# Patient Record
Sex: Male | Born: 1940 | Race: White | Hispanic: No | Marital: Married | Smoking: Former smoker
Health system: Southern US, Community
[De-identification: ages and names within clinical notes are randomized; demographics above are authoritative.]

## PROBLEM LIST (undated history)

## (undated) DIAGNOSIS — N400 Enlarged prostate without lower urinary tract symptoms: Secondary | ICD-10-CM

## (undated) DIAGNOSIS — G629 Polyneuropathy, unspecified: Secondary | ICD-10-CM

## (undated) DIAGNOSIS — E039 Hypothyroidism, unspecified: Secondary | ICD-10-CM

## (undated) DIAGNOSIS — R519 Headache, unspecified: Secondary | ICD-10-CM

## (undated) DIAGNOSIS — E785 Hyperlipidemia, unspecified: Secondary | ICD-10-CM

## (undated) DIAGNOSIS — R51 Headache: Secondary | ICD-10-CM

## (undated) DIAGNOSIS — IMO0001 Reserved for inherently not codable concepts without codable children: Secondary | ICD-10-CM

## (undated) DIAGNOSIS — M5410 Radiculopathy, site unspecified: Secondary | ICD-10-CM

## (undated) DIAGNOSIS — M1990 Unspecified osteoarthritis, unspecified site: Secondary | ICD-10-CM

## (undated) DIAGNOSIS — K219 Gastro-esophageal reflux disease without esophagitis: Secondary | ICD-10-CM

## (undated) HISTORY — PX: TONSILLECTOMY: SUR1361

## (undated) HISTORY — PX: INGUINAL HERNIA REPAIR: SUR1180

## (undated) HISTORY — PX: COLONOSCOPY: SHX174

## (undated) HISTORY — DX: Hyperlipidemia, unspecified: E78.5

## (undated) HISTORY — DX: Polyneuropathy, unspecified: G62.9

## (undated) HISTORY — PX: SHOULDER SURGERY: SHX246

## (undated) HISTORY — DX: Radiculopathy, site unspecified: M54.10

## (undated) HISTORY — PX: CATARACT EXTRACTION, BILATERAL: SHX1313

## (undated) HISTORY — DX: Hypothyroidism, unspecified: E03.9

## (undated) HISTORY — PX: UPPER GASTROINTESTINAL ENDOSCOPY: SHX188

## (undated) HISTORY — DX: Reserved for inherently not codable concepts without codable children: IMO0001

## (undated) HISTORY — PX: KNEE ARTHROSCOPY: SUR90

## (undated) HISTORY — DX: Gastro-esophageal reflux disease without esophagitis: K21.9

---

## 1999-01-14 ENCOUNTER — Emergency Department (HOSPITAL_COMMUNITY): Admission: EM | Admit: 1999-01-14 | Discharge: 1999-01-14 | Payer: Self-pay | Admitting: Emergency Medicine

## 1999-01-14 ENCOUNTER — Encounter: Payer: Self-pay | Admitting: Emergency Medicine

## 1999-02-26 ENCOUNTER — Other Ambulatory Visit: Admission: RE | Admit: 1999-02-26 | Discharge: 1999-02-26 | Payer: Self-pay | Admitting: Gastroenterology

## 2000-04-01 ENCOUNTER — Ambulatory Visit (HOSPITAL_COMMUNITY): Admission: RE | Admit: 2000-04-01 | Discharge: 2000-04-01 | Payer: Self-pay | Admitting: Endocrinology

## 2000-04-01 ENCOUNTER — Encounter: Payer: Self-pay | Admitting: Endocrinology

## 2004-08-31 ENCOUNTER — Ambulatory Visit: Payer: Self-pay | Admitting: Endocrinology

## 2004-09-07 ENCOUNTER — Ambulatory Visit: Payer: Self-pay | Admitting: Endocrinology

## 2004-10-09 ENCOUNTER — Ambulatory Visit: Payer: Self-pay | Admitting: Endocrinology

## 2005-09-03 ENCOUNTER — Ambulatory Visit: Payer: Self-pay | Admitting: Endocrinology

## 2005-09-10 ENCOUNTER — Ambulatory Visit: Payer: Self-pay | Admitting: Endocrinology

## 2005-11-01 ENCOUNTER — Ambulatory Visit: Payer: Self-pay | Admitting: Endocrinology

## 2006-07-14 ENCOUNTER — Ambulatory Visit: Payer: Self-pay | Admitting: Gastroenterology

## 2006-07-15 ENCOUNTER — Ambulatory Visit: Payer: Self-pay | Admitting: Gastroenterology

## 2006-07-16 ENCOUNTER — Emergency Department (HOSPITAL_COMMUNITY): Admission: EM | Admit: 2006-07-16 | Discharge: 2006-07-16 | Payer: Self-pay | Admitting: Emergency Medicine

## 2006-07-18 ENCOUNTER — Ambulatory Visit: Payer: Self-pay | Admitting: Endocrinology

## 2006-07-18 ENCOUNTER — Ambulatory Visit: Payer: Self-pay | Admitting: Gastroenterology

## 2006-07-27 ENCOUNTER — Emergency Department (HOSPITAL_COMMUNITY): Admission: EM | Admit: 2006-07-27 | Discharge: 2006-07-27 | Payer: Self-pay | Admitting: Emergency Medicine

## 2006-08-26 ENCOUNTER — Ambulatory Visit: Payer: Self-pay | Admitting: Endocrinology

## 2006-09-03 ENCOUNTER — Encounter: Admission: RE | Admit: 2006-09-03 | Discharge: 2006-09-03 | Payer: Self-pay | Admitting: Endocrinology

## 2006-09-07 ENCOUNTER — Ambulatory Visit: Payer: Self-pay

## 2006-09-23 ENCOUNTER — Ambulatory Visit: Payer: Self-pay | Admitting: Endocrinology

## 2006-09-23 LAB — CONVERTED CEMR LAB
ALT: 20 units/L (ref 0–40)
AST: 23 units/L (ref 0–37)
Alkaline Phosphatase: 46 units/L (ref 39–117)
Basophils Relative: 1 % (ref 0.0–1.0)
GFR calc non Af Amer: 72 mL/min
Glomerular Filtration Rate, Af Am: 87 mL/min/{1.73_m2}
Glucose, Bld: 107 mg/dL — ABNORMAL HIGH (ref 70–99)
HCT: 44.6 % (ref 39.0–52.0)
HDL: 34.7 mg/dL — ABNORMAL LOW (ref >39.0)
Leukocytes, UA: NEGATIVE
Monocytes Absolute: 0.6 10*3/uL (ref 0.2–0.7)
PSA: 0.59 ng/mL (ref 0.10–4.00)
Platelets: 302 10*3/uL (ref 150–400)
Potassium: 4.5 meq/L (ref 3.5–5.1)
RBC: 4.85 M/uL (ref 4.22–5.81)
RDW: 11.9 % (ref 11.5–14.6)
Sodium: 138 meq/L (ref 135–145)
Specific Gravity, Urine: 1.025 (ref 1.000–1.03)
Total Bilirubin: 1.6 mg/dL — ABNORMAL HIGH (ref 0.3–1.2)
Total Protein, Urine: NEGATIVE mg/dL
Total Protein: 6.9 g/dL (ref 6.0–8.3)
Triglyceride fasting, serum: 94 mg/dL (ref 0–149)
Urobilinogen, UA: 0.2 (ref 0.0–1.0)
VLDL: 19 mg/dL (ref 0–40)
WBC: 6.9 10*3/uL (ref 4.5–10.5)
pH: 6 (ref 5.0–8.0)

## 2006-09-30 ENCOUNTER — Ambulatory Visit (HOSPITAL_COMMUNITY): Admission: RE | Admit: 2006-09-30 | Discharge: 2006-09-30 | Payer: Self-pay | Admitting: General Surgery

## 2006-10-21 ENCOUNTER — Ambulatory Visit: Payer: Self-pay | Admitting: Endocrinology

## 2006-11-28 ENCOUNTER — Ambulatory Visit: Payer: Self-pay | Admitting: Endocrinology

## 2006-11-28 LAB — CONVERTED CEMR LAB
Cholesterol: 160 mg/dL (ref 0–200)
HDL: 35.3 mg/dL — ABNORMAL LOW (ref >39.0)
VLDL: 18 mg/dL (ref 0–40)

## 2007-02-21 ENCOUNTER — Emergency Department (HOSPITAL_COMMUNITY): Admission: EM | Admit: 2007-02-21 | Discharge: 2007-02-21 | Payer: Self-pay | Admitting: Emergency Medicine

## 2007-05-19 ENCOUNTER — Encounter: Payer: Self-pay | Admitting: Endocrinology

## 2007-05-19 DIAGNOSIS — K219 Gastro-esophageal reflux disease without esophagitis: Secondary | ICD-10-CM | POA: Insufficient documentation

## 2007-05-19 DIAGNOSIS — K5730 Diverticulosis of large intestine without perforation or abscess without bleeding: Secondary | ICD-10-CM | POA: Insufficient documentation

## 2007-06-20 IMAGING — CR DG CHEST 1V PORT
1 series · 1 of 1 positions shown · non-contrast
Comparison: No prior studies.

CLINICAL DATA: Chest pain, nausea, vomiting, dizziness and weakness. 
 PORTABLE CHEST - 1 VIEW ? 07/27/06 (5995):

[view not recorded]
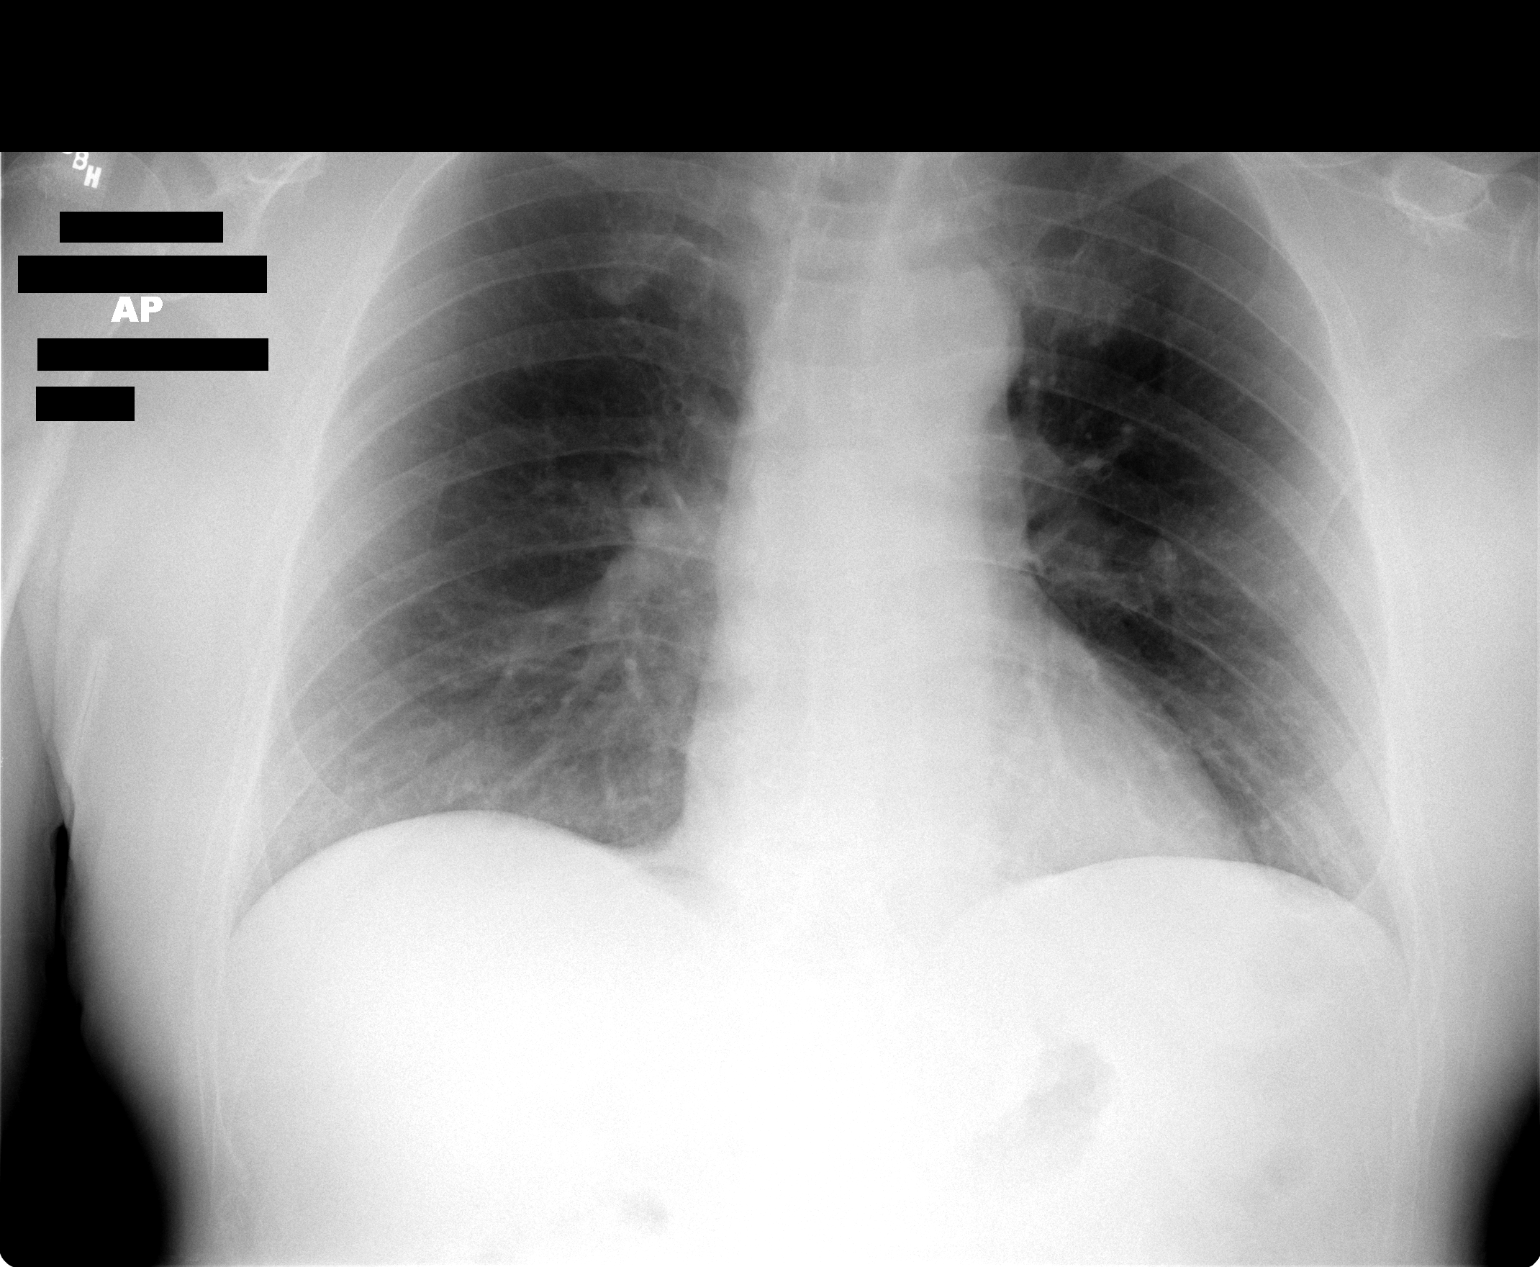

[1 of 1 positions shown; findings below may reference images not displayed]

FINDINGS: Very mild bibasilar atelectasis is present.  No edema or focal infiltrate.  No pleural fluid is identified.  The heart size is normal.
IMPRESSION: Mild bibasilar atelectasis.

## 2008-10-04 HISTORY — PX: PROSTATE SURGERY: SHX751

## 2008-12-09 ENCOUNTER — Encounter: Admission: RE | Admit: 2008-12-09 | Discharge: 2008-12-09 | Payer: Self-pay | Admitting: Family Medicine

## 2009-06-26 ENCOUNTER — Encounter (INDEPENDENT_AMBULATORY_CARE_PROVIDER_SITE_OTHER): Payer: Self-pay | Admitting: Urology

## 2009-06-26 ENCOUNTER — Ambulatory Visit (HOSPITAL_BASED_OUTPATIENT_CLINIC_OR_DEPARTMENT_OTHER): Admission: RE | Admit: 2009-06-26 | Discharge: 2009-06-27 | Payer: Self-pay | Admitting: Urology

## 2011-02-19 NOTE — Assessment & Plan Note (Signed)
[facility]                           GASTROENTEROLOGY OFFICE NOTE   NAME:SPROLESOren, Barella                       MRN:          [id number]  DATE:07/14/2006                            DOB:          03/22/1941    Mr. Polhemus is a 70 year old white male self-referred for evaluation of  rectal pain, itching, burning, and bleeding.   HISTORY OF PRESENT ILLNESS:  I have previously evaluated Mr. Natarajan for  history of diverticulitis. He was status post flexible sigmoidoscopy in June  1999 which revealed diverticulosis and internal hemorrhoids. He underwent  colonoscopy in May 2000 which revealed small to moderate sized internal  hemorrhoids, diverticulosis, and an ascending colon polyp with lesion which  turned out to be polypoid mucosa. He states over the past few months he has  had problems with recurrent anal and rectal pain, burning, itching, and  small amounts of blood per rectum. He has had temporary relief from Anusol  HC suppositories, but his symptoms recur as soon as he discontinues the  medication. His symptoms have not been fully controlled with the medication.  His symptoms did not respond to over-the-counter Preparation H. He has noted  a very slight change in his bowel habit pattern with a long history of about  two bowel movements a day, and now he generally has 2-3 bowel movements a  day. He has no abdominal pain, weight loss, fevers, chills or melena. No  family history of colon cancer, colon polyps or inflammatory bowel disease.   PAST MEDICAL HISTORY:  Hypertension, hyperlipidemia, hypothyroidism,  diverticulosis, hemorrhoids, GERD, status post left inguinal hernia repair,  history of duodenitis.   CURRENT MEDICATIONS:  Listed on the chart have been reviewed.   MEDICATION ALLERGIES:  None known.   Social History and Review of Systems per the handwritten form.   PHYSICAL EXAMINATION:  GENERAL:  In no acute distress.  VITAL SIGNS:   Height 5 feet 11 inches, weight 200 pounds, blood pressure  130/70, pulse 20 and regular.  HEENT:  Anicteric sclerae, oropharynx clear.  CHEST:  Clear to auscultation bilaterally.  CARDIAC:  Regular rate and rhythm without murmurs.  ABDOMEN:  Soft, nontender, nondistended. Normoactive bowel sounds, no  palpable organomegaly, masses or hernias.  RECTAL:  Digital rectal examination reveals no external or internal lesions.  Hemoccult negative, yellow brown stool in the vault.  EXTREMITIES:  Without clubbing, cyanosis or edema.  NEUROLOGIC:  Alert and oriented x3, grossly nonfocal.   ASSESSMENT AND PLAN:  A small volume hematochezia associated with anal pain,  itching, and burning, presumed ongoing hemorrhoidal symptoms. Rule out  colorectal neoplasms, proctitis, fissures, and other disorders. Continue  Anusol suppositories and begin all standard rectal care and hemorrhoidal  care measures. Increase fluid and fiber intake. Risks, benefits, and  alternatives to colonoscopy with possible biopsy, possible polypectomy, and  possible destruction of internal hemorrhoids discussed with the patient, and  he consents to proceed. This will be scheduled electively.       Venita Lick. Russella Dar, MD, [facility]      MTS/MedQ  DD:  07/14/2006  DT:  07/14/2006  Job #:  [id number]

## 2011-02-19 NOTE — Assessment & Plan Note (Signed)
[facility]                                   ON-CALL NOTE   NAME:Arthur Beck, Arthur Beck                       MRN:          [id number]  DATE:07/16/2006                            DOB:          1941-03-24    TELEPHONE MESSAGE  Arthur Beck is a 70 year old gentleman with a colonoscopy by Dr. Russella Dar  yesterday.  He calls with episode of rectal bleeding.  He reports that Dr.  Russella Dar injected his hemorrhoids.  He had acute urinary retention last night  after colonoscopy, and was seen and evaluated in the emergency room, and put  a Foley catheter in his bladder.  He still has the Foley catheter, which he  was instructed to keep in until Monday until he sees Dr. Everardo All.  As far as  his rectal bleeding is concerned, he had a small amount of blood per rectum  this morning, but he did not have any significant abdominal or rectal pain.   I have assured him that it is not uncommon to pass small amounts of bright  red blood after hemorrhoidal injections.  He has suppositories to take; I  assumed Anusol-HC suppositories.  He will increase them to 2 a day from 1 at  bedtime, and will report to me any large amount of blood he may have this  weekend.       Hedwig Morton. Juanda Chance, MD      DMB/MedQ  DD:  07/16/2006  DT:  07/18/2006  Job #:  [id number]   cc:   Venita Lick. Russella Dar, MD, [facility]

## 2011-02-19 NOTE — Assessment & Plan Note (Signed)
[facility]                           GASTROENTEROLOGY OFFICE NOTE   NAME:SPROLESCayden, Granholm                       MRN:          [id number]  DATE:07/18/2006                            DOB:          Nov 18, 1940    Mr. [facility] is seen today as an urgent work in.  He had moderately severe  lower abdominal pain associated with urinary retention as well as small  volume hematochezia and some mild rectal discomfort following his  colonoscopy.  He presented to the emergency room, where a Foley catheter was  inserted and his abdominal pain resolved.  His rectal discomfort has  resolved and he has had no bleeding in the past 24 hours.  He notes no  fevers, chills, nausea, vomiting or abdominal pain today.  His Foley  catheter remains in place.  He does have a history of BPH.  He is status  post TUNA in July 2004 and he has been treated with Avodart for the past 3  months.   CURRENT MEDICATIONS:  Listed on the chart of date, updated and reviewed.   MEDICATION ALLERGIES:  None known.   EXAM:  No acute distress.  Blood pressure is 130/70.  Pulse 88 and regular.  CHEST:  Clear to auscultation bilaterally.  CARDIAC:  Regular rate and rhythm without murmurs appreciated.  ABDOMEN:  Is soft.  Nontender, nondistended.  Normoactive bowel sounds.  No  palpable organomegaly, masses or hernias.  GU:  With a Foley catheter in place.   ASSESSMENT AND PLAN:  1. Urinary retention following colonoscopy with hemorrhoid injection.      These symptoms are most likely related to the sedation but hemorrhoidal      injection may have contributed.  He is advised to maintain standard      rectal care instructions and use Sitz baths 2 to 3 times a day for the      next 3 to 5 days.  2. Followup office visit with Dr. Everardo All today for assessment of his      Foley catheter and genitourinary symptoms.  If he has further      genitourinary problems, he will need to follow up with Dr.  Patsi Sears.  3. Internal hemorrhoids with mild rectal pain and mild bleeding following      injection.  These are known side effects from hemorrhoidal injection.      His symptoms have resolved.  He is to maintain a long term high fiber      diet with adequate fluid intake.  May use Anusol [facility]      suppositories daily as needed.  Standard rectal care instructions with      Sitz baths for the next few days are recommended.       Venita Lick. Russella Dar, MD, [facility]      MTS/MedQ  DD:  07/18/2006  DT:  07/18/2006  Job #:  [id number]   cc:   Gregary Signs A. Everardo All, MD

## 2011-02-19 NOTE — Op Note (Signed)
[name], HALT                ACCOUNT NO.:  [id number]   MEDICAL RECORD NO.:  [id number]          PATIENT TYPE:  LAMB   LOCATION:                               FACILITY:  [facility]   PHYSICIAN:  Anselm Pancoast. Weatherly, M.D.DATE OF BIRTH:  Jul 11, 1941   DATE OF PROCEDURE:  09/30/2006  DATE OF DISCHARGE:                               OPERATIVE REPORT   PREOPERATIVE DIAGNOSIS:  Chronic posterior anal fissure.   POSTOPERATIVE DIAGNOSIS:  Chronic posterior anal fissure.   OPERATION:  Left lateral internal sphincterotomy.   SURGEON:  Anselm Pancoast. Zachery Dakins, M.D.   ANESTHESIA:  General anesthesia in the lithotomy position.   HISTORY:  Arthur Beck is a 70 year old male whom I first met  approximately 2 months' ago with a posterior anal fissure. He had been  treated with some type of injection by Dr. Russella Dar after a colonoscopy.  Said that he had a lot of pain, spasm, etcetera; and it appeared that  the treatment had not been successful as he had a posterior anal  fissure.  We treated with the __________ and Xylocaine ointment. He  appeared to be improved, and then he had an acute flare up and saw me in  the office approximately a week ago; and said that he was having pain  just as bad as previously.  You could still see the fissure posteriorly  and I recommended that we do an internal sphincterotomy and he is here  today for the planned procedure.   DESCRIPTION OF PROCEDURE:  The patient was placed up in the lithotomy  position after general anesthesia by endotracheal tube; and he had  received a gram of Ancef.  The fissure was still visible posteriorly,  but it looked as if it was trying to heal and I could not see any other  pathology except for just kind of mild internal hemorrhoids.  After  Betadine and prep, I made a little incision, left lateral, elevated the  internal sphincter over  hemostat and then divided it with cautery.  There were a few additional little fibers that I picked  up then divided  these.  I feel that I had done an adequate internal sphincterotomy.  The  little incision was closed with a couple of sutures with 3-0 chromic and  then I put a figure-of-eight chromic in the posterior fissure,  posterior, to try to pull it together a little bit to hopefully speed  the healing.  The patient tolerated the procedure nicely and then after,  I reused the Betadine and anesthetized the anal sphincter area with 15  mL of __________  with epinephrine for immediate postoperative pain  control.  The patient was  extubated and sent to recovery room in stable  postoperative condition.  He will be released after a short stay in the  recovery room.  I will see him back in the office in approximately a  week.  Hopefully he will be able to return to work later this next week;  and I gave him Vicodin for pain postoperatively.  ______________________________  Anselm Pancoast. Zachery Dakins, M.D.     WJW/MEDQ  D:  09/30/2006  T:  09/30/2006  Job:  [id number]   cc:   Venita Lick. Russella Dar, MD, FACG  [address]  [place]  [place] [address]

## 2011-07-19 ENCOUNTER — Ambulatory Visit (HOSPITAL_BASED_OUTPATIENT_CLINIC_OR_DEPARTMENT_OTHER)
Admission: RE | Admit: 2011-07-19 | Discharge: 2011-07-20 | Disposition: A | Discharge status: Home / Self Care | Payer: Medicare Other | Source: Ambulatory Visit | Attending: Orthopedic Surgery | Admitting: Orthopedic Surgery

## 2011-07-19 DIAGNOSIS — M24119 Other articular cartilage disorders, unspecified shoulder: Secondary | ICD-10-CM | POA: Insufficient documentation

## 2011-07-19 DIAGNOSIS — S43429A Sprain of unspecified rotator cuff capsule, initial encounter: Secondary | ICD-10-CM | POA: Insufficient documentation

## 2011-07-19 DIAGNOSIS — Z79899 Other long term (current) drug therapy: Secondary | ICD-10-CM | POA: Insufficient documentation

## 2011-07-19 DIAGNOSIS — X58XXXA Exposure to other specified factors, initial encounter: Secondary | ICD-10-CM | POA: Insufficient documentation

## 2011-07-19 DIAGNOSIS — M19019 Primary osteoarthritis, unspecified shoulder: Secondary | ICD-10-CM | POA: Insufficient documentation

## 2011-07-19 DIAGNOSIS — M25819 Other specified joint disorders, unspecified shoulder: Secondary | ICD-10-CM | POA: Insufficient documentation

## 2011-07-19 DIAGNOSIS — E039 Hypothyroidism, unspecified: Secondary | ICD-10-CM | POA: Insufficient documentation

## 2011-07-22 NOTE — Op Note (Signed)
NAMESAMAJ, WESSELLS                ACCOUNT NO.:  [id number]  MEDICAL RECORD NO.:  [id number]  LOCATION:                               FACILITY:  [facility]  PHYSICIAN:  Marlowe Kays, M.D.  DATE OF BIRTH:  [birth year]  DATE OF PROCEDURE:  07/19/2011 DATE OF DISCHARGE:                              OPERATIVE REPORT   PREOPERATIVE DIAGNOSES: 1. Labral tear. 2. Chronic impingement syndrome, secondary to acromioclavicular     arthritis and downsloping acromion 3. Small rotator cuff tear.  POSTOPERATIVE DIAGNOSES: 1. Labral tear. 2. Chronic impingement syndrome, secondary to acromioclavicular     arthritis and downsloping acromion 3. Two small rotator cuff tear.  OPERATION: 1. Left shoulder arthroscopy with labral debridement. 2. Arthroscopic subacromial decompression. 3. Arthroscopic distal clavicle decompression. 4. Mini open repair of 2 rotator cuff tears.  SURGEON:  Marlowe Kays, M.D.  ASSISTANT:  Jaquelyn Bitter. Chabon, PA-C ANESTHESIA:  Interscalene block followed by general anesthesia.  DETAILS OF PROCEDURE:  Prophylactic antibiotics, satisfied general anesthesia, beach-chair position, right shoulder girdle was prepped with DuraPrep draped in sterile field.  Anatomy of the shoulder joint was marked out.  Time out performed.  I injected subacromial space and placement of the posterior and lateral portals with Marcaine with adrenaline.  Mr. Pincus Sanes assistance was required for the case because of difficulties in simply holding the arm, maneuvering the arm, and an extra pair of hands for equipment usage.  Through a posterior soft spot portal, I atraumatically entered the glenohumeral joint.  There was some wear of the glenoid.  Labral tears were identified anterior and posterior to the biceps anchor.  Biceps tendon was intact.  Humeral head looked to be intact.  I advanced the scope between the subscapularis and biceps tendon using a switching stick making the anterior  incision.  I placed a metal cannula over the switching stick and then entered the joint with a 4.2 shaver debriding down the labral tears.  Pre and post films were taken.  I then redirected the scope in the subacromial space through a lateral portal, introduced a 4.2 shaver once again.  There was a large amount of bursal tissue, which I evacuated with the shaver.  I then brought in a 4 mm oval bur, burring down very prominent spurring of both the distal clavicle and the acromion.  The rotator cuff had been severely abraded on its bursal surface.  I smoothed this down with the 4.2 shaver.  I then went back and forth between the bur and the 90 degree ArthroCare vaporizer removing soft tissue from the undersurface of the distal clavicle and acromion and burring these areas down so that there was wide decompression documented with pictures with his arm abducted.  The rotator cuff tear was identified with a probe.  I then placed a spinal needle at the location noted with arthroscopic visualization and made a vertical incision at this point.  I spread the deltoid and found not 1 but 2 rotator cuff tears adjacent to one and another.  For exposure purposes, I trimmed away a small amount of additional anterior acromion.  He had a wide decompression at the conclusion of this.  I repaired  both tears with interrupted side-to-side sutures of #1 Ethibond with pre and post repair films being taken.  The wound was then irrigated with sterile saline.  I closed the small slit in the deltoid muscle with interrupted #1 Vicryl, the subcutaneous tissue with 3-0 Vicryl, Steri-Strips on the skin, 4-0 nylon on 3 portals, followed by Betadine, Adaptic, dry sterile dressing shoulder immobilizer.  He tolerated the procedure well was taken to recovery room in satisfied condition with no known complications.          ______________________________ Marlowe Kays, M.D.     JA/MEDQ  D:  07/19/2011  T:   07/20/2011  Job:  [id number]  Electronically Signed by Marlowe Kays M.D. on 07/22/2011 12:25:52 PM

## 2011-11-22 ENCOUNTER — Other Ambulatory Visit: Payer: Self-pay | Admitting: Gastroenterology

## 2011-11-22 DIAGNOSIS — R12 Heartburn: Secondary | ICD-10-CM

## 2011-11-22 DIAGNOSIS — R1013 Epigastric pain: Secondary | ICD-10-CM

## 2011-11-26 ENCOUNTER — Ambulatory Visit
Admission: RE | Admit: 2011-11-26 | Discharge: 2011-11-26 | Disposition: A | Discharge status: Home / Self Care | Payer: Medicare Other | Source: Ambulatory Visit | Attending: Gastroenterology | Admitting: Gastroenterology

## 2011-11-26 DIAGNOSIS — R12 Heartburn: Secondary | ICD-10-CM

## 2011-11-26 DIAGNOSIS — R1013 Epigastric pain: Secondary | ICD-10-CM

## 2014-06-12 ENCOUNTER — Ambulatory Visit: Payer: Medicare Other | Admitting: Cardiology

## 2014-06-14 ENCOUNTER — Encounter: Payer: Self-pay | Admitting: *Deleted

## 2014-06-21 ENCOUNTER — Ambulatory Visit (INDEPENDENT_AMBULATORY_CARE_PROVIDER_SITE_OTHER): Payer: Commercial Managed Care - HMO | Admitting: Cardiology

## 2014-06-21 ENCOUNTER — Encounter: Payer: Self-pay | Admitting: Cardiology

## 2014-06-21 VITALS — BP 136/85 | HR 71 | Ht 70.0 in | Wt 194.1 lb

## 2014-06-21 DIAGNOSIS — R072 Precordial pain: Secondary | ICD-10-CM | POA: Diagnosis not present

## 2014-06-21 DIAGNOSIS — R079 Chest pain, unspecified: Secondary | ICD-10-CM | POA: Insufficient documentation

## 2014-06-21 NOTE — Patient Instructions (Signed)
Your physician recommends that you schedule a follow-up appointment a day after your stress test

## 2014-06-21 NOTE — Progress Notes (Signed)
HPI The patient presents for evaluation of chest discomfort. He has no past cardiac history. He had a negative stress perfusion study by his report about 5 years ago. He said for 6-8 months he's been getting some exertional chest discomfort. Only happens if his doing something heavy like being in his garden. He says the pain 6/10 discomfort it goes away after several minutes. Substernal with some upper epigastric discomfort as well. He is having radiation to his neck or to his arms. He doesn't have any shortness of breath, PND or orthopnea. He can exercise vigorously most times without bringing this on. He said he was up and down stairs 4 times a day to his basement without any symptoms. He lifts weights and exercises. He has had some occasions of discomfort associated with certain foods. Yesterday he ate a spicy sandwich and didn't do well after this. However, he's had none of the substernal discomfort at rest.   Not on File  Current Outpatient Prescriptions  Medication Sig Dispense Refill  . cholecalciferol (VITAMIN D) 1000 UNITS tablet Take 1,000 Units by mouth daily.      Marland Kitchen levothyroxine (SYNTHROID, LEVOTHROID) 112 MCG tablet Take 112 mcg by mouth daily before breakfast.      . Red Yeast Rice 600 MG CAPS Take by mouth.       No current facility-administered medications for this visit.    Past Medical History  Diagnosis Date  . Hyperlipidemia   . Hypothyroid   . Reflux     Past Surgical History  Procedure Laterality Date  . Inguinal hernia repair      left  . Prostate surgery      TUNA  . Shoulder surgery      right  . Knee arthroscopy      left    No family history on file.  History   Social History  . Marital Status: Married    Spouse Name: N/A    Number of Children: N/A  . Years of Education: N/A   Occupational History  . Not on file.   Social History Main Topics  . Smoking status: Former Games developer  . Smokeless tobacco: Current User    Types: Snuff  . Alcohol  Use: Not on file  . Drug Use: Not on file  . Sexual Activity: Not on file   Other Topics Concern  . Not on file   Social History Narrative  . No narrative on file    ROS:  Positive for glasses, reflux, occasional joint pains. Otherwise as stated in the HPI and negative for all other systems.  PHYSICAL EXAM BP 136/85  Pulse 71  Ht  (1.778 m)  Wt 194 lb 1.6 oz (88.043 kg)  BMI 27.85 kg/m2 GENERAL:  Well appearing HEENT:  Pupils equal round and reactive, fundi not visualized, oral mucosa unremarkable NECK:  No jugular venous distention, waveform within normal limits, carotid upstroke brisk and symmetric, no bruits, no thyromegaly LYMPHATICS:  No cervical, inguinal adenopathy LUNGS:  Clear to auscultation bilaterally BACK:  No CVA tenderness CHEST:  Unremarkable HEART:  PMI not displaced or sustained,S1 and S2 within normal limits, no S3, no S4, no clicks, no rubs, no murmurs ABD:  Flat, positive bowel sounds normal in frequency in pitch, no bruits, no rebound, no guarding, no midline pulsatile mass, no hepatomegaly, no splenomegaly EXT:  2 plus pulses throughout, no edema, no cyanosis no clubbing SKIN:  No rashes no nodules NEURO:  Cranial nerves II through XII  grossly intact, motor grossly intact throughout [facility]:  Cognitively intact, oriented to person place and time   EKG:  Sinus rhythm, rate 69, axis within normal limits, intervals within normal limits, no acute ST-T wave changes.   ASSESSMENT AND PLAN  CHEST PAIN:  The patient has exertional chest discomfort this a stable pattern. I do think he could be exertional angina. The first step will be to risk stratify with an exercise perfusion study. He and I have had a long discussion that even if the stress test is normal I would want to know if he has any resting symptoms or increase in his anginal pattern. I will see him back after the stress test to discuss this further. He will get some sublingual nitroglycerin in case  he has any resting comfort. He does present to the emergency room should that happen.  DYSLIPIDEMIA:   He has some mild dyslipidemia. This is followed by his primary provider. I will defer to his management.

## 2014-07-09 ENCOUNTER — Telehealth (HOSPITAL_COMMUNITY): Payer: Self-pay

## 2014-07-09 NOTE — Telephone Encounter (Signed)
Encounter complete. 

## 2014-07-10 ENCOUNTER — Encounter: Payer: Self-pay | Admitting: *Deleted

## 2014-07-11 ENCOUNTER — Ambulatory Visit (HOSPITAL_COMMUNITY)
Admission: RE | Admit: 2014-07-11 | Discharge: 2014-07-11 | Disposition: A | Discharge status: Home / Self Care | Payer: Medicare HMO | Source: Ambulatory Visit | Attending: Cardiology | Admitting: Cardiology

## 2014-07-11 DIAGNOSIS — R5383 Other fatigue: Secondary | ICD-10-CM | POA: Insufficient documentation

## 2014-07-11 DIAGNOSIS — Z6827 Body mass index (BMI) 27.0-27.9, adult: Secondary | ICD-10-CM | POA: Diagnosis not present

## 2014-07-11 DIAGNOSIS — R0609 Other forms of dyspnea: Secondary | ICD-10-CM | POA: Insufficient documentation

## 2014-07-11 DIAGNOSIS — I10 Essential (primary) hypertension: Secondary | ICD-10-CM | POA: Diagnosis not present

## 2014-07-11 DIAGNOSIS — R079 Chest pain, unspecified: Secondary | ICD-10-CM | POA: Diagnosis present

## 2014-07-11 DIAGNOSIS — F17290 Nicotine dependence, other tobacco product, uncomplicated: Secondary | ICD-10-CM | POA: Insufficient documentation

## 2014-07-11 DIAGNOSIS — Z8249 Family history of ischemic heart disease and other diseases of the circulatory system: Secondary | ICD-10-CM | POA: Diagnosis not present

## 2014-07-11 DIAGNOSIS — R072 Precordial pain: Secondary | ICD-10-CM

## 2014-07-11 MED ORDER — TECHNETIUM TC 99M SESTAMIBI GENERIC - CARDIOLITE
30.9000 | Freq: Once | INTRAVENOUS | Status: AC | PRN
  Administered 2014-07-11: 30.9 via INTRAVENOUS

## 2014-07-11 MED ORDER — TECHNETIUM TC 99M SESTAMIBI GENERIC - CARDIOLITE
10.4000 | Freq: Once | INTRAVENOUS | Status: AC | PRN
  Administered 2014-07-11: 10 via INTRAVENOUS

## 2014-07-11 NOTE — Procedures (Addendum)
[facility] [address] [place] [place] [address] [phone]  Cardiology Nuclear Med Study  Arthur Beck is a 73 y.o. male     MRN : [id number]     DOB: 1941-08-25  Procedure Date: 07/11/2014  Nuclear Med Background Indication for Stress Test:  Evaluation for Ischemia History:  No prior cardiac or respiratory history reported;NUC MPI 5 years ago-nonischemic;Not in Epic. Cardiac Risk Factors: Family History - CAD, History of Smoking and Lipids  Symptoms:  Chest Pain, DOE and Fatigue   Nuclear Pre-Procedure Caffeine/Decaff Intake:  7:00pm NPO After: 5:00am   IV Site: R Forearm  IV 0.9% NS with Angio Cath:  22g  Chest Size (in):  44"  IV Started by: Berdie OgrenAmanda Wease, RN  Height: 5\' 10"  (1.778 m)  Cup Size: n/a  BMI:  Body mass index is 27.84 kg/(m^2). Weight:  194 lb (87.998 kg)   Tech Comments:  n/a    Nuclear Med Study 1 or 2 day study: 1 day  Stress Test Type:  Stress  Order Authorizing Provider:  Rollene RotundaJames Hochrein, MD   Resting Radionuclide: Technetium 5227m Sestamibi  Resting Radionuclide Dose: 10.4 mCi   Stress Radionuclide:  Technetium 5927m Sestamibi  Stress Radionuclide Dose: 30.9 mCi           Stress Protocol Rest HR: 66 Stress HR: 144  Rest BP: 154/88 Stress BP: 194/112  Exercise Time (min): 7:45 METS: 9.6   Predicted Max HR: 148 bpm % Max HR: 97.3 bpm Rate Pressure Product: [id number]  Dose of Adenosine (mg):  n/a Dose of Lexiscan: n/a mg  Dose of Atropine (mg): n/a Dose of Dobutamine: n/a mcg/kg/min (at max HR)  Stress Test Technologist: Esperanza Sheetserry-Marie Martin, CCT Nuclear Technologist: Gonzella LexPam Phillips, CNMT   Rest Procedure:  Myocardial perfusion imaging was performed at rest 45 minutes following the intravenous administration of Technetium 8127m Sestamibi. Stress Procedure:  The patient performed treadmill exercise using a Bruce  Protocol for 7:45 minutes. The patient stopped due to HTN Diastolic and SOB and denied any  chest pain.  There were no significant ST-T wave changes.  Technetium 6827m Sestamibi was injected at peak exercise and myocardial perfusion imaging was performed after a brief delay.  Transient Ischemic Dilatation (Normal <1.22):  0.96  QGS EDV:  99 ml QGS ESV:  44 ml LV Ejection Fraction: 56%  Rest ECG: NSR - Normal EKG  Stress ECG: No significant ST segment change suggestive of ischemia.  QPS Raw Data Images:  Normal; no motion artifact; normal heart/lung ratio. Stress Images:  There is decreased uptake in the apex. Rest Images:  There is decreased uptake in the apex. Subtraction (SDS):  No evidence of ischemia.  Impression Exercise Capacity:  Good exercise capacity. BP Response:  Hypertensive blood pressure response. Clinical Symptoms:  No significant symptoms noted. ECG Impression:  No significant ST segment change suggestive of ischemia. Comparison with Prior Nuclear Study: No images to compare  Overall Impression:  Low risk stress nuclear study with small, moderate intensity fixed apical, inferoapical defect - suspect artifact, but could be small area of scar (SDS 0).  LV Wall Motion:  Normal Wall Motion; LVEF 56%  Arthur NoseKenneth Beck. Airrion Otting, MD, [facility] SystemFACC Board Certified in Nuclear Cardiology Attending Cardiologist [facility]  Arthur NoseHILTY,Arthur Conly C, MD  07/11/2014 12:26 PM

## 2014-07-12 ENCOUNTER — Ambulatory Visit (INDEPENDENT_AMBULATORY_CARE_PROVIDER_SITE_OTHER): Payer: Commercial Managed Care - HMO | Admitting: Cardiology

## 2014-07-12 ENCOUNTER — Encounter: Payer: Self-pay | Admitting: Cardiology

## 2014-07-12 VITALS — BP 138/82 | HR 61 | Ht 70.0 in | Wt 194.4 lb

## 2014-07-12 DIAGNOSIS — R072 Precordial pain: Secondary | ICD-10-CM

## 2014-07-12 NOTE — Progress Notes (Signed)
   HPI The patient presents for evaluation of chest discomfort. This was described in the recent note.  Since I last saw him he has had no new symptoms.  The patient denies any new symptoms such as chest discomfort, neck or arm discomfort. There has been no new shortness of breath, PND or orthopnea. There have been no reported palpitations, presyncope or syncope.  He did have a stress perfusion study which demonstrated a small apical defect which could have been artifact versus some ischemia. However, he was a low risk study.  No Known Allergies  Current Outpatient Prescriptions  Medication Sig Dispense Refill  . cholecalciferol (VITAMIN D) 1000 UNITS tablet Take 1,000 Units by mouth daily.      Marland Kitchen. levothyroxine (SYNTHROID, LEVOTHROID) 112 MCG tablet Take 112 mcg by mouth daily before breakfast.      . Red Yeast Rice 600 MG CAPS Take by mouth.       No current facility-administered medications for this visit.    Past Medical History  Diagnosis Date  . Hyperlipidemia     Mild  . Hypothyroid   . Reflux     Past Surgical History  Procedure Laterality Date  . Inguinal hernia repair      left  . Prostate surgery      TUNA  . Shoulder surgery      right  . Knee arthroscopy      left    ROS:  Positive for glasses, reflux, occasional joint pains. Otherwise as stated in the HPI and negative for all other systems.  PHYSICAL EXAM BP 138/82  Pulse 61  Ht 5\' 10"  (1.778 m)  Wt 194 lb 6.4 oz (88.179 kg)  BMI 27.89 kg/m2 GENERAL:  Well appearing NECK:  No jugular venous distention, waveform within normal limits, carotid upstroke brisk and symmetric, no bruits, no thyromegaly LUNGS:  Clear to auscultation bilaterally HEART:  PMI not displaced or sustained,S1 and S2 within normal limits, no S3, no S4, no clicks, no rubs, no murmurs ABD:  Flat, positive bowel sounds normal in frequency in pitch, no bruits, no rebound, no guarding, no midline pulsatile mass, no hepatomegaly, no  splenomegaly EXT:  2 plus pulses throughout, no edema, no cyanosis no clubbing   ASSESSMENT AND PLAN  CHEST PAIN:   Given the low risk nuclear study which most likely demonstrates artifact in the absence of ongoing symptoms no further cardiovascular testing is suggested. We discussed these results and will follow with his primary provider.  He should continue with risk reduction.  DYSLIPIDEMIA:   He has some mild dyslipidemia. This is followed by his primary provider. I will defer to his management.

## 2014-07-12 NOTE — Patient Instructions (Signed)
Your physician recommends that you schedule a follow-up appointment  On as needed basis

## 2014-10-07 ENCOUNTER — Ambulatory Visit
Admission: RE | Admit: 2014-10-07 | Discharge: 2014-10-07 | Disposition: A | Discharge status: Home / Self Care | Payer: Commercial Managed Care - HMO | Source: Ambulatory Visit | Attending: Family Medicine | Admitting: Family Medicine

## 2014-10-07 ENCOUNTER — Other Ambulatory Visit: Payer: Self-pay | Admitting: Family Medicine

## 2014-10-07 DIAGNOSIS — R1084 Generalized abdominal pain: Secondary | ICD-10-CM

## 2014-10-15 ENCOUNTER — Other Ambulatory Visit: Payer: Self-pay | Admitting: Gastroenterology

## 2014-10-15 DIAGNOSIS — R1084 Generalized abdominal pain: Secondary | ICD-10-CM

## 2014-10-17 ENCOUNTER — Ambulatory Visit
Admission: RE | Admit: 2014-10-17 | Discharge: 2014-10-17 | Disposition: A | Discharge status: Home / Self Care | Payer: Commercial Managed Care - HMO | Source: Ambulatory Visit | Attending: Gastroenterology | Admitting: Gastroenterology

## 2014-10-17 DIAGNOSIS — R1084 Generalized abdominal pain: Secondary | ICD-10-CM

## 2014-10-17 MED ORDER — IOHEXOL 300 MG/ML  SOLN
100.0000 mL | Freq: Once | INTRAMUSCULAR | Status: AC | PRN
  Administered 2014-10-17: 100 mL via INTRAVENOUS

## 2014-10-18 ENCOUNTER — Other Ambulatory Visit: Payer: Commercial Managed Care - HMO

## 2015-08-18 ENCOUNTER — Other Ambulatory Visit: Payer: Self-pay | Admitting: Family Medicine

## 2015-08-18 DIAGNOSIS — R519 Headache, unspecified: Secondary | ICD-10-CM

## 2015-08-18 DIAGNOSIS — R51 Headache: Principal | ICD-10-CM

## 2015-09-02 ENCOUNTER — Ambulatory Visit
Admission: RE | Admit: 2015-09-02 | Discharge: 2015-09-02 | Disposition: A | Discharge status: Home / Self Care | Payer: Commercial Managed Care - HMO | Source: Ambulatory Visit | Attending: Family Medicine | Admitting: Family Medicine

## 2015-09-02 DIAGNOSIS — R51 Headache: Principal | ICD-10-CM

## 2015-09-02 DIAGNOSIS — R519 Headache, unspecified: Secondary | ICD-10-CM

## 2015-09-02 MED ORDER — GADOBENATE DIMEGLUMINE 529 MG/ML IV SOLN
19.0000 mL | Freq: Once | INTRAVENOUS | Status: AC | PRN
  Administered 2015-09-02: 19 mL via INTRAVENOUS

## 2015-10-09 ENCOUNTER — Encounter: Payer: Self-pay | Admitting: Neurology

## 2015-10-09 ENCOUNTER — Ambulatory Visit (INDEPENDENT_AMBULATORY_CARE_PROVIDER_SITE_OTHER): Payer: PPO | Admitting: Neurology

## 2015-10-09 VITALS — BP 132/74 | HR 73 | Ht 70.0 in | Wt 197.0 lb

## 2015-10-09 DIAGNOSIS — G44209 Tension-type headache, unspecified, not intractable: Secondary | ICD-10-CM | POA: Diagnosis not present

## 2015-10-09 DIAGNOSIS — R51 Headache: Secondary | ICD-10-CM

## 2015-10-09 DIAGNOSIS — H9193 Unspecified hearing loss, bilateral: Secondary | ICD-10-CM | POA: Diagnosis not present

## 2015-10-09 DIAGNOSIS — H9190 Unspecified hearing loss, unspecified ear: Secondary | ICD-10-CM | POA: Insufficient documentation

## 2015-10-09 DIAGNOSIS — R42 Dizziness and giddiness: Secondary | ICD-10-CM | POA: Diagnosis not present

## 2015-10-09 DIAGNOSIS — R519 Headache, unspecified: Secondary | ICD-10-CM | POA: Insufficient documentation

## 2015-10-09 MED ORDER — NORTRIPTYLINE HCL 10 MG PO CAPS: 10.0000 mg | ORAL_CAPSULE | Freq: Every day | ORAL | Status: DC

## 2015-10-09 NOTE — Progress Notes (Signed)
Chart forwarded.  

## 2015-10-09 NOTE — Progress Notes (Signed)
NEUROLOGY CONSULTATION NOTE  Arthur Beck MRN: [id number] DOB: September 09, 1941  Referring provider: Dr. Clovis RileyMitchell Primary care provider: Dr. Clovis RileyMitchell  Reason for consult:  Dizziness, headache.  HISTORY OF PRESENT ILLNESS: Arthur Beck is a 75 year old right-handed male with hypothyroidism, hyperlipidemia, BPH and GERD who presents for headache.  History obtained by patient, PCP note and old ENT note.  Labs and imaging of brain MRI reviewed.  For the past 4 months, he has experienced a chronic non-throbbing low-intensity (2-3/10) headache.  At first it was daily, up until December.  Since December, it is intermittent but still frequent.  He has not taken any medications to treat the headache, however he does take Advil, diclofenac and Aleve daily to treat arthritis.  He also reports dizziness.  It is not a lightheadedness.  There is a sense of feeling off-balance.  Sometimes, he has episodes of obvious spinning sensation.  It can be triggered if he bends forward or looks up.  He sometimes feels a little nauseous.  He denies visual disturbance, diplopia, or tinnitus.  He has longstanding history of hearing loss and wears hearing aids.  He feels his hearing is worse.  He denies any precipitating event.  She had an MRI of the brain with and without contrast on 09/02/15, which was unremarkable. Labs, including CBC and BMP, were unremarkable.  Sed Rate was 8 and CRP was less than 0.3.  He had a similar episode back in 2001, which lasted for several months.  [facility] E[facility].  He also recalls seeing a neurologist as well.  He was found to have asymmetric hearing loss, worse in the left ear.  MRI at that time was unremarkable.  PAST MEDICAL HISTORY: Past Medical History  Diagnosis Date  . Hyperlipidemia     Mild  . Hypothyroid   . Reflux     PAST SURGICAL HISTORY: Past Surgical History  Procedure Laterality Date  . Inguinal hernia repair      left  .  Prostate surgery      TUNA  . Shoulder surgery      right  . Knee arthroscopy      left    MEDICATIONS: Current Outpatient Prescriptions on File Prior to Visit  Medication Sig Dispense Refill  . cholecalciferol (VITAMIN D) 1000 UNITS tablet Take 1,000 Units by mouth daily.    Marland Kitchen. levothyroxine (SYNTHROID, LEVOTHROID) 112 MCG tablet Take 112 mcg by mouth daily before breakfast.    . Red Yeast Rice 600 MG CAPS Take by mouth.     No current facility-administered medications on file prior to visit.    ALLERGIES: No Known Allergies  FAMILY HISTORY: Family History  Problem Relation Age of Onset  . Kidney disease Father   . Alzheimer's disease Mother   . Peripheral vascular disease Brother [id number]  . Diabetes Brother     Died age 74    SOCIAL HISTORY: Social History   Social History  . Marital Status: Married    Spouse Name: N/A  . Number of Children: 2  . Years of Education: N/A   Occupational History  .     Social History Main Topics  . Smoking status: Former Smoker -- .5 years    Types: Cigarettes  . Smokeless tobacco: Current User    Types: Snuff     Comment: Quit 50 years ago  . Alcohol Use: Not on file  . Drug Use: Not on file  .  Sexual Activity: Not on file   Other Topics Concern  . 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  Pt has a hs diploma.     REVIEW OF SYSTEMS: Constitutional: No fevers, chills, or sweats, no generalized fatigue, change in appetite Eyes: No visual changes, double vision, eye pain Ear, nose and throat: No hearing loss, ear pain, nasal congestion, sore throat Cardiovascular: No chest pain, palpitations Respiratory:  No shortness of breath at rest or with exertion, wheezes GastrointestinaI: No nausea, vomiting, diarrhea, abdominal pain, fecal incontinence Genitourinary:  No dysuria, urinary retention or frequency Musculoskeletal:  No neck pain, back pain Integumentary: No rash, pruritus, skin  lesions Neurological: as above Psychiatric: No depression, insomnia, anxiety Endocrine: No palpitations, fatigue, diaphoresis, mood swings, change in appetite, change in weight, increased thirst Hematologic/Lymphatic:  No anemia, purpura, petechiae. Allergic/Immunologic: no itchy/runny eyes, nasal congestion, recent allergic reactions, rashes  PHYSICAL EXAM: Filed Vitals:   10/09/15 0915  BP: 132/74  Pulse: 73   General: No acute distress.  Patient appears well-groomed.  Head:  Normocephalic/atraumatic Eyes:  fundi unremarkable, without vessel changes, exudates, hemorrhages or papilledema. Neck: supple, no paraspinal tenderness, full range of motion Back: No paraspinal tenderness Heart: regular rate and rhythm Lungs: Clear to auscultation bilaterally. Vascular: No carotid bruits. Neurological Exam: Mental status: alert and oriented to person, place, and time, recent and remote memory intact, fund of knowledge intact, attention and concentration intact, speech fluent and not dysarthric, language intact. Cranial nerves: CN I: not tested CN II: pupils equal, round and reactive to light, visual fields intact, fundi unremarkable, without vessel changes, exudates, hemorrhages or papilledema. CN III, IV, VI:  full range of motion, no nystagmus, no ptosis CN V: facial sensation intact CN VII: upper and lower face symmetric CN VIII: decreased hearing in left ear. CN IX, X: gag intact, uvula midline CN XI: sternocleidomastoid and trapezius muscles intact CN XII: tongue midline Bulk & Tone: normal, no fasciculations. Motor:  5/5 throughout  Sensation:  Pinprick intact. Mildly decreased vibration sensation in toes. Deep Tendon Reflexes:  2+ throughout, toes downgoing.  Finger to nose testing:  Without dysmetria.  Kinetic tremor  Heel to shin:  Without dysmetria.  Gait:  Normal station and stride.  Able to turn.  Cautious but able to tandem walk. Romberg negative.  IMPRESSION: Dizziness.   No clear etiology Tension-type headaches Hearing loss  PLAN: 1.  Will start nortriptyline 10mg  at bedtime to address chronic headache 2.  Advised to limit pain reliever use to no more than 2 days out of the week if possible, to prevent rebound headache 3.  Refer for vestibular rehab 4.  Recommended having hearing rechecked. 5.  Follow up in 3 months but he was asked to call in 4 weeks with update.  Thank you for allowing me to take part in the care of this patient.  Shon Millet, DO  CC:  Lupe Carney, MD

## 2015-10-09 NOTE — Patient Instructions (Addendum)
1.  To try and reduce frequency of headaches, we will start nortriptyline 10mg  at bedtime.  Please call in 4 weeks with update and we can increase dose if headaches not improved. 2.  For dizziness, will refer you for physical therapy (vestibular rehabilitation) 3.  Consider getting your hearing checked again 4.  Try to limit all pain relievers (Advil, diclofenac, Aleve) to no more than 2 days out of the week if possible to prevent rebound headache 5.  Follow up in 3 months.

## 2015-10-09 NOTE — Progress Notes (Signed)
Referral for vestibular rehab at [facility] placed via Epic and faxed to [phone].

## 2016-01-14 ENCOUNTER — Ambulatory Visit: Payer: PPO | Admitting: Neurology

## 2016-01-14 DIAGNOSIS — Z029 Encounter for administrative examinations, unspecified: Secondary | ICD-10-CM

## 2016-06-15 ENCOUNTER — Encounter: Payer: Self-pay | Admitting: Gastroenterology

## 2016-10-26 DIAGNOSIS — R3915 Urgency of urination: Secondary | ICD-10-CM | POA: Diagnosis not present

## 2016-10-26 DIAGNOSIS — R350 Frequency of micturition: Secondary | ICD-10-CM | POA: Diagnosis not present

## 2016-11-23 DIAGNOSIS — R3915 Urgency of urination: Secondary | ICD-10-CM | POA: Diagnosis not present

## 2016-11-23 DIAGNOSIS — R350 Frequency of micturition: Secondary | ICD-10-CM | POA: Diagnosis not present

## 2016-12-24 DIAGNOSIS — R350 Frequency of micturition: Secondary | ICD-10-CM | POA: Diagnosis not present

## 2016-12-24 DIAGNOSIS — R3915 Urgency of urination: Secondary | ICD-10-CM | POA: Diagnosis not present

## 2017-01-04 DIAGNOSIS — R5383 Other fatigue: Secondary | ICD-10-CM | POA: Diagnosis not present

## 2017-01-21 DIAGNOSIS — R350 Frequency of micturition: Secondary | ICD-10-CM | POA: Diagnosis not present

## 2017-01-24 DIAGNOSIS — M25511 Pain in right shoulder: Secondary | ICD-10-CM | POA: Diagnosis not present

## 2017-02-17 DIAGNOSIS — H18413 Arcus senilis, bilateral: Secondary | ICD-10-CM | POA: Diagnosis not present

## 2017-02-17 DIAGNOSIS — Z961 Presence of intraocular lens: Secondary | ICD-10-CM | POA: Diagnosis not present

## 2017-02-17 DIAGNOSIS — H04123 Dry eye syndrome of bilateral lacrimal glands: Secondary | ICD-10-CM | POA: Diagnosis not present

## 2017-02-17 DIAGNOSIS — H11423 Conjunctival edema, bilateral: Secondary | ICD-10-CM | POA: Diagnosis not present

## 2017-02-17 DIAGNOSIS — H40012 Open angle with borderline findings, low risk, left eye: Secondary | ICD-10-CM | POA: Diagnosis not present

## 2017-02-17 DIAGNOSIS — H16143 Punctate keratitis, bilateral: Secondary | ICD-10-CM | POA: Diagnosis not present

## 2017-02-17 DIAGNOSIS — H353131 Nonexudative age-related macular degeneration, bilateral, early dry stage: Secondary | ICD-10-CM | POA: Diagnosis not present

## 2017-02-17 DIAGNOSIS — H40002 Preglaucoma, unspecified, left eye: Secondary | ICD-10-CM | POA: Diagnosis not present

## 2017-02-17 DIAGNOSIS — H35371 Puckering of macula, right eye: Secondary | ICD-10-CM | POA: Diagnosis not present

## 2017-02-17 DIAGNOSIS — H11153 Pinguecula, bilateral: Secondary | ICD-10-CM | POA: Diagnosis not present

## 2017-02-18 DIAGNOSIS — R3915 Urgency of urination: Secondary | ICD-10-CM | POA: Diagnosis not present

## 2017-02-18 DIAGNOSIS — R350 Frequency of micturition: Secondary | ICD-10-CM | POA: Diagnosis not present

## 2017-03-10 DIAGNOSIS — R69 Illness, unspecified: Secondary | ICD-10-CM | POA: Diagnosis not present

## 2017-03-16 DIAGNOSIS — R350 Frequency of micturition: Secondary | ICD-10-CM | POA: Diagnosis not present

## 2017-03-16 DIAGNOSIS — Z0000 Encounter for general adult medical examination without abnormal findings: Secondary | ICD-10-CM | POA: Diagnosis not present

## 2017-03-16 DIAGNOSIS — R3915 Urgency of urination: Secondary | ICD-10-CM | POA: Diagnosis not present

## 2017-03-16 DIAGNOSIS — N401 Enlarged prostate with lower urinary tract symptoms: Secondary | ICD-10-CM | POA: Diagnosis not present

## 2017-03-16 DIAGNOSIS — M79672 Pain in left foot: Secondary | ICD-10-CM | POA: Diagnosis not present

## 2017-03-16 DIAGNOSIS — E039 Hypothyroidism, unspecified: Secondary | ICD-10-CM | POA: Diagnosis not present

## 2017-03-16 DIAGNOSIS — Z79899 Other long term (current) drug therapy: Secondary | ICD-10-CM | POA: Diagnosis not present

## 2017-03-16 DIAGNOSIS — H9113 Presbycusis, bilateral: Secondary | ICD-10-CM | POA: Diagnosis not present

## 2017-03-16 DIAGNOSIS — Z6829 Body mass index (BMI) 29.0-29.9, adult: Secondary | ICD-10-CM | POA: Diagnosis not present

## 2017-03-16 DIAGNOSIS — K219 Gastro-esophageal reflux disease without esophagitis: Secondary | ICD-10-CM | POA: Diagnosis not present

## 2017-03-18 DIAGNOSIS — R3915 Urgency of urination: Secondary | ICD-10-CM | POA: Diagnosis not present

## 2017-03-18 DIAGNOSIS — R350 Frequency of micturition: Secondary | ICD-10-CM | POA: Diagnosis not present

## 2017-03-22 DIAGNOSIS — M79672 Pain in left foot: Secondary | ICD-10-CM | POA: Diagnosis not present

## 2017-04-15 DIAGNOSIS — R3915 Urgency of urination: Secondary | ICD-10-CM | POA: Diagnosis not present

## 2017-04-15 DIAGNOSIS — R350 Frequency of micturition: Secondary | ICD-10-CM | POA: Diagnosis not present

## 2017-05-13 DIAGNOSIS — R350 Frequency of micturition: Secondary | ICD-10-CM | POA: Diagnosis not present

## 2017-05-26 DIAGNOSIS — G8929 Other chronic pain: Secondary | ICD-10-CM | POA: Diagnosis not present

## 2017-05-26 DIAGNOSIS — M79675 Pain in left toe(s): Secondary | ICD-10-CM | POA: Diagnosis not present

## 2017-06-10 DIAGNOSIS — R350 Frequency of micturition: Secondary | ICD-10-CM | POA: Diagnosis not present

## 2017-06-10 DIAGNOSIS — R3915 Urgency of urination: Secondary | ICD-10-CM | POA: Diagnosis not present

## 2017-06-29 DIAGNOSIS — E559 Vitamin D deficiency, unspecified: Secondary | ICD-10-CM | POA: Diagnosis not present

## 2017-06-29 DIAGNOSIS — Z0000 Encounter for general adult medical examination without abnormal findings: Secondary | ICD-10-CM | POA: Diagnosis not present

## 2017-06-29 DIAGNOSIS — E7800 Pure hypercholesterolemia, unspecified: Secondary | ICD-10-CM | POA: Diagnosis not present

## 2017-06-29 DIAGNOSIS — E039 Hypothyroidism, unspecified: Secondary | ICD-10-CM | POA: Diagnosis not present

## 2017-06-29 DIAGNOSIS — Z23 Encounter for immunization: Secondary | ICD-10-CM | POA: Diagnosis not present

## 2017-06-29 DIAGNOSIS — K219 Gastro-esophageal reflux disease without esophagitis: Secondary | ICD-10-CM | POA: Diagnosis not present

## 2017-06-29 DIAGNOSIS — M1990 Unspecified osteoarthritis, unspecified site: Secondary | ICD-10-CM | POA: Diagnosis not present

## 2017-07-08 DIAGNOSIS — R3915 Urgency of urination: Secondary | ICD-10-CM | POA: Diagnosis not present

## 2017-07-08 DIAGNOSIS — R350 Frequency of micturition: Secondary | ICD-10-CM | POA: Diagnosis not present

## 2017-08-05 DIAGNOSIS — R350 Frequency of micturition: Secondary | ICD-10-CM | POA: Diagnosis not present

## 2017-08-05 DIAGNOSIS — R3915 Urgency of urination: Secondary | ICD-10-CM | POA: Diagnosis not present

## 2017-08-18 DIAGNOSIS — R300 Dysuria: Secondary | ICD-10-CM | POA: Diagnosis not present

## 2017-08-18 DIAGNOSIS — N401 Enlarged prostate with lower urinary tract symptoms: Secondary | ICD-10-CM | POA: Diagnosis not present

## 2017-08-18 DIAGNOSIS — R350 Frequency of micturition: Secondary | ICD-10-CM | POA: Diagnosis not present

## 2017-08-18 DIAGNOSIS — R351 Nocturia: Secondary | ICD-10-CM | POA: Diagnosis not present

## 2017-08-30 DIAGNOSIS — R3915 Urgency of urination: Secondary | ICD-10-CM | POA: Diagnosis not present

## 2017-08-30 DIAGNOSIS — R351 Nocturia: Secondary | ICD-10-CM | POA: Diagnosis not present

## 2017-08-30 DIAGNOSIS — R350 Frequency of micturition: Secondary | ICD-10-CM | POA: Diagnosis not present

## 2017-09-01 DIAGNOSIS — R350 Frequency of micturition: Secondary | ICD-10-CM | POA: Diagnosis not present

## 2017-09-01 DIAGNOSIS — R3915 Urgency of urination: Secondary | ICD-10-CM | POA: Diagnosis not present

## 2017-09-14 DIAGNOSIS — R69 Illness, unspecified: Secondary | ICD-10-CM | POA: Diagnosis not present

## 2017-10-06 DIAGNOSIS — R3915 Urgency of urination: Secondary | ICD-10-CM | POA: Diagnosis not present

## 2017-10-06 DIAGNOSIS — N401 Enlarged prostate with lower urinary tract symptoms: Secondary | ICD-10-CM | POA: Diagnosis not present

## 2017-10-06 DIAGNOSIS — R351 Nocturia: Secondary | ICD-10-CM | POA: Diagnosis not present

## 2017-10-06 DIAGNOSIS — R350 Frequency of micturition: Secondary | ICD-10-CM | POA: Diagnosis not present

## 2017-10-10 ENCOUNTER — Other Ambulatory Visit: Payer: Self-pay

## 2017-10-10 ENCOUNTER — Other Ambulatory Visit: Payer: Self-pay | Admitting: Urology

## 2017-10-10 ENCOUNTER — Encounter (HOSPITAL_BASED_OUTPATIENT_CLINIC_OR_DEPARTMENT_OTHER): Payer: Self-pay | Admitting: *Deleted

## 2017-10-10 NOTE — Progress Notes (Signed)
Arrive 1230 10-17-17 [facility], reviewed ower instructions with patient by phone, lov nwueo dr Alvan Dameadam jeffe 10-09-15 epic and on chart, last ekg 10-18-13 epic and on chart, stress test 07-11-14 eoic and on chart, patient instructedc clear liquids from midngiht until 800am am then npo, needs hemaglobin.

## 2017-10-17 ENCOUNTER — Ambulatory Visit (HOSPITAL_BASED_OUTPATIENT_CLINIC_OR_DEPARTMENT_OTHER): Payer: Medicare HMO | Admitting: Anesthesiology

## 2017-10-17 ENCOUNTER — Encounter (HOSPITAL_BASED_OUTPATIENT_CLINIC_OR_DEPARTMENT_OTHER)
Admission: RE | Disposition: A | Discharge status: Home / Self Care | Payer: Self-pay | Source: Ambulatory Visit | Attending: Urology

## 2017-10-17 ENCOUNTER — Observation Stay (HOSPITAL_BASED_OUTPATIENT_CLINIC_OR_DEPARTMENT_OTHER)
Admission: RE | Admit: 2017-10-17 | Discharge: 2017-10-18 | Disposition: A | Discharge status: Home / Self Care | Payer: Medicare HMO | Source: Ambulatory Visit | Attending: Urology | Admitting: Urology

## 2017-10-17 ENCOUNTER — Encounter (HOSPITAL_BASED_OUTPATIENT_CLINIC_OR_DEPARTMENT_OTHER): Payer: Self-pay

## 2017-10-17 DIAGNOSIS — M1990 Unspecified osteoarthritis, unspecified site: Secondary | ICD-10-CM | POA: Insufficient documentation

## 2017-10-17 DIAGNOSIS — N400 Enlarged prostate without lower urinary tract symptoms: Secondary | ICD-10-CM | POA: Diagnosis not present

## 2017-10-17 DIAGNOSIS — E785 Hyperlipidemia, unspecified: Secondary | ICD-10-CM | POA: Diagnosis not present

## 2017-10-17 DIAGNOSIS — K219 Gastro-esophageal reflux disease without esophagitis: Secondary | ICD-10-CM | POA: Diagnosis not present

## 2017-10-17 DIAGNOSIS — E039 Hypothyroidism, unspecified: Secondary | ICD-10-CM | POA: Insufficient documentation

## 2017-10-17 DIAGNOSIS — Z7982 Long term (current) use of aspirin: Secondary | ICD-10-CM | POA: Insufficient documentation

## 2017-10-17 DIAGNOSIS — R338 Other retention of urine: Secondary | ICD-10-CM | POA: Diagnosis not present

## 2017-10-17 DIAGNOSIS — R3915 Urgency of urination: Secondary | ICD-10-CM | POA: Insufficient documentation

## 2017-10-17 DIAGNOSIS — N401 Enlarged prostate with lower urinary tract symptoms: Secondary | ICD-10-CM | POA: Diagnosis not present

## 2017-10-17 DIAGNOSIS — Z79899 Other long term (current) drug therapy: Secondary | ICD-10-CM | POA: Diagnosis not present

## 2017-10-17 DIAGNOSIS — N138 Other obstructive and reflux uropathy: Secondary | ICD-10-CM

## 2017-10-17 DIAGNOSIS — Z87891 Personal history of nicotine dependence: Secondary | ICD-10-CM | POA: Insufficient documentation

## 2017-10-17 DIAGNOSIS — K5790 Diverticulosis of intestine, part unspecified, without perforation or abscess without bleeding: Secondary | ICD-10-CM | POA: Diagnosis not present

## 2017-10-17 HISTORY — DX: Headache, unspecified: R51.9

## 2017-10-17 HISTORY — PX: TRANSURETHRAL RESECTION OF PROSTATE: SHX73

## 2017-10-17 HISTORY — DX: Unspecified osteoarthritis, unspecified site: M19.90

## 2017-10-17 HISTORY — DX: Benign prostatic hyperplasia without lower urinary tract symptoms: N40.0

## 2017-10-17 HISTORY — DX: Headache: R51

## 2017-10-17 SURGERY — TURP (TRANSURETHRAL RESECTION OF PROSTATE)
Anesthesia: Monitor Anesthesia Care

## 2017-10-17 MED ORDER — SODIUM CHLORIDE 0.9% FLUSH
3.0000 mL | INTRAVENOUS | Status: DC | PRN
  Filled 2017-10-17: qty 3

## 2017-10-17 MED ORDER — HYDROCODONE-ACETAMINOPHEN 5-325 MG PO TABS
ORAL_TABLET | ORAL | Status: AC
  Filled 2017-10-17: qty 1

## 2017-10-17 MED ORDER — FENTANYL CITRATE (PF) 100 MCG/2ML IJ SOLN
INTRAMUSCULAR | Status: AC
  Filled 2017-10-17: qty 2

## 2017-10-17 MED ORDER — MORPHINE SULFATE (PF) 2 MG/ML IV SOLN
2.0000 mg | INTRAVENOUS | Status: DC | PRN
  Filled 2017-10-17: qty 2

## 2017-10-17 MED ORDER — SODIUM CHLORIDE 0.9 % IR SOLN
3000.0000 mL | Status: DC
  Filled 2017-10-17: qty 3000

## 2017-10-17 MED ORDER — DEXAMETHASONE SODIUM PHOSPHATE 4 MG/ML IJ SOLN
INTRAMUSCULAR | Status: DC | PRN
  Administered 2017-10-17: 10 mg via INTRAVENOUS

## 2017-10-17 MED ORDER — DIPHENHYDRAMINE HCL 12.5 MG/5ML PO ELIX
12.5000 mg | ORAL_SOLUTION | Freq: Four times a day (QID) | ORAL | Status: DC | PRN
  Filled 2017-10-17: qty 5

## 2017-10-17 MED ORDER — DIPHENHYDRAMINE HCL 50 MG/ML IJ SOLN
12.5000 mg | Freq: Four times a day (QID) | INTRAMUSCULAR | Status: DC | PRN
  Filled 2017-10-17: qty 0.25

## 2017-10-17 MED ORDER — LIDOCAINE 2% (20 MG/ML) 5 ML SYRINGE
INTRAMUSCULAR | Status: DC | PRN
  Administered 2017-10-17: 100 mg via INTRAVENOUS

## 2017-10-17 MED ORDER — PROPOFOL 10 MG/ML IV BOLUS
INTRAVENOUS | Status: DC | PRN
  Administered 2017-10-17: 200 mg via INTRAVENOUS
  Administered 2017-10-17: 50 mg via INTRAVENOUS

## 2017-10-17 MED ORDER — LIDOCAINE 2% (20 MG/ML) 5 ML SYRINGE
INTRAMUSCULAR | Status: AC
  Filled 2017-10-17: qty 5

## 2017-10-17 MED ORDER — LACTATED RINGERS IV SOLN
INTRAVENOUS | Status: DC
  Administered 2017-10-17 (×2): via INTRAVENOUS
  Filled 2017-10-17: qty 1000

## 2017-10-17 MED ORDER — CEFAZOLIN SODIUM-DEXTROSE 2-4 GM/100ML-% IV SOLN
INTRAVENOUS | Status: AC
  Filled 2017-10-17: qty 100

## 2017-10-17 MED ORDER — SODIUM CHLORIDE 0.9 % IV SOLN
250.0000 mL | INTRAVENOUS | Status: DC | PRN
Start: 2017-10-18 — End: 2017-10-18
  Filled 2017-10-17: qty 250

## 2017-10-17 MED ORDER — PANTOPRAZOLE SODIUM 40 MG PO TBEC
40.0000 mg | DELAYED_RELEASE_TABLET | Freq: Every day | ORAL | Status: DC
  Filled 2017-10-17: qty 1

## 2017-10-17 MED ORDER — ASPIRIN 81 MG PO CHEW
81.0000 mg | CHEWABLE_TABLET | Freq: Every day | ORAL | Status: DC
  Filled 2017-10-17: qty 1

## 2017-10-17 MED ORDER — FENTANYL CITRATE (PF) 100 MCG/2ML IJ SOLN
INTRAMUSCULAR | Status: DC | PRN
  Administered 2017-10-17 (×5): 25 ug via INTRAVENOUS

## 2017-10-17 MED ORDER — PROPOFOL 10 MG/ML IV BOLUS
INTRAVENOUS | Status: AC
  Filled 2017-10-17: qty 20

## 2017-10-17 MED ORDER — LEVOTHYROXINE SODIUM 112 MCG PO TABS
112.0000 ug | ORAL_TABLET | Freq: Every day | ORAL | Status: DC
  Filled 2017-10-17: qty 1

## 2017-10-17 MED ORDER — ONDANSETRON HCL 4 MG/2ML IJ SOLN
INTRAMUSCULAR | Status: DC | PRN
  Administered 2017-10-17: 4 mg via INTRAVENOUS

## 2017-10-17 MED ORDER — ONDANSETRON HCL 4 MG/2ML IJ SOLN
INTRAMUSCULAR | Status: AC
  Filled 2017-10-17: qty 2

## 2017-10-17 MED ORDER — CEFAZOLIN SODIUM-DEXTROSE 2-4 GM/100ML-% IV SOLN
2.0000 g | Freq: Three times a day (TID) | INTRAVENOUS | Status: AC
  Administered 2017-10-17: 2 g via INTRAVENOUS
  Filled 2017-10-17: qty 100

## 2017-10-17 MED ORDER — BELLADONNA ALKALOIDS-OPIUM 16.2-60 MG RE SUPP
1.0000 | Freq: Four times a day (QID) | RECTAL | Status: DC | PRN
  Filled 2017-10-17: qty 1

## 2017-10-17 MED ORDER — ZOLPIDEM TARTRATE 5 MG PO TABS
5.0000 mg | ORAL_TABLET | Freq: Every evening | ORAL | Status: DC | PRN
  Filled 2017-10-17: qty 1

## 2017-10-17 MED ORDER — HYDROCODONE-ACETAMINOPHEN 5-325 MG PO TABS
ORAL_TABLET | ORAL | Status: AC
Start: 2017-10-17 — End: 2017-10-17
  Filled 2017-10-17: qty 1

## 2017-10-17 MED ORDER — CEFAZOLIN SODIUM-DEXTROSE 2-4 GM/100ML-% IV SOLN
2.0000 g | INTRAVENOUS | Status: AC
  Administered 2017-10-17: 2 g via INTRAVENOUS
  Filled 2017-10-17: qty 100

## 2017-10-17 MED ORDER — SODIUM CHLORIDE 0.9% FLUSH
3.0000 mL | Freq: Two times a day (BID) | INTRAVENOUS | Status: DC
  Filled 2017-10-17: qty 3

## 2017-10-17 MED ORDER — ONDANSETRON HCL 4 MG/2ML IJ SOLN
4.0000 mg | INTRAMUSCULAR | Status: DC | PRN
  Filled 2017-10-17: qty 2

## 2017-10-17 MED ORDER — DEXAMETHASONE SODIUM PHOSPHATE 10 MG/ML IJ SOLN
INTRAMUSCULAR | Status: AC
  Filled 2017-10-17: qty 1

## 2017-10-17 MED ORDER — HYDROCODONE-ACETAMINOPHEN 5-325 MG PO TABS
1.0000 | ORAL_TABLET | ORAL | Status: DC | PRN
  Administered 2017-10-17 (×2): 1 via ORAL
  Filled 2017-10-17: qty 2

## 2017-10-17 MED ORDER — ACETAMINOPHEN 325 MG PO TABS
650.0000 mg | ORAL_TABLET | ORAL | Status: DC | PRN
  Filled 2017-10-17: qty 2

## 2017-10-17 SURGICAL SUPPLY — 20 items
BAG DRAIN URO-CYSTO SKYTR STRL (DRAIN) ×2 IMPLANT
BAG DRN ANRFLXCHMBR STRAP LEK (BAG)
BAG DRN UROCATH (DRAIN) ×1
BAG URINE DRAINAGE (UROLOGICAL SUPPLIES) IMPLANT
BAG URINE LEG 19OZ MD ST LTX (BAG) IMPLANT
CATH FOLEY 3WAY 30CC 22F (CATHETERS) ×2 IMPLANT
CLOTH BEACON ORANGE TIMEOUT ST (SAFETY) ×2 IMPLANT
ELECT REM PT RETURN 9FT ADLT (ELECTROSURGICAL)
ELECTRODE REM PT RTRN 9FT ADLT (ELECTROSURGICAL) IMPLANT
EVACUATOR MICROVAS BLADDER (UROLOGICAL SUPPLIES) IMPLANT
GLOVE BIO SURGEON STRL SZ8 (GLOVE) ×2 IMPLANT
GOWN STRL REUS W/TWL LRG LVL3 (GOWN DISPOSABLE) ×2 IMPLANT
IV NS IRRIG 3000ML ARTHROMATIC (IV SOLUTION) ×3 IMPLANT
KIT RM TURNOVER CYSTO AR (KITS) ×2 IMPLANT
MANIFOLD NEPTUNE II (INSTRUMENTS) ×1 IMPLANT
PACK CYSTO (CUSTOM PROCEDURE TRAY) ×2 IMPLANT
PLUG CATH AND CAP STER (CATHETERS) ×2 IMPLANT
SYR 30ML LL (SYRINGE) ×2 IMPLANT
SYRINGE IRR TOOMEY STRL 70CC (SYRINGE) ×1 IMPLANT
TUBE CONNECTING 12X1/4 (SUCTIONS) ×1 IMPLANT

## 2017-10-17 NOTE — H&P (Signed)
Urology Admission H&P  Chief Complaint: incomplete bladder emptyin  History of Present Illness: [name] is a 76yo with a history f BPH who has worsening incompelete emptying and urinary urgency. On office cystoscopy he was found to have significant prostate regrowth. No new LUTS. No hematuria  Past Medical History:  Diagnosis Date  . Arthritis   . BPH (benign prostatic hyperplasia)   . Headache   . Hyperlipidemia    Mild  . Hypothyroid   . Reflux    Past Surgical History:  Procedure Laterality Date  . INGUINAL HERNIA REPAIR     left  . KNEE ARTHROSCOPY     left  . PROSTATE SURGERY  2010   TUNA  . SHOULDER SURGERY     right    Home Medications:  Current Facility-Administered Medications  Medication Dose Route Frequency Provider Last Rate Last Dose  . ceFAZolin (ANCEF) IVPB 2g/100 mL premix  2 g Intravenous 30 min Pre-Op Ronne BinningMcKenzie, Mardene CelestePatrick L, MD      . lactated ringers infusion   Intravenous Continuous Eilene Ghaziose, George, MD 50 mL/hr at 10/17/17 1331     Allergies: No Known Allergies  Family History  Problem Relation Age of Onset  . Kidney disease Father   . Alzheimer's disease Mother   . Peripheral vascular disease Brother [id number]  . Diabetes Brother        Died age 76   Social History:  reports that he has quit smoking. His smoking use included cigarettes. He quit after 0.50 years of use. His smokeless tobacco use includes snuff. He reports that he does not drink alcohol or use drugs.  Review of Systems  Genitourinary: Positive for frequency and urgency.  All other systems reviewed and are negative.   Physical Exam:  Vital signs in last 24 hours: Temp:  [98.7 F (37.1 C)] 98.7 F (37.1 C) (01/14 1231) Pulse Rate:  [75] 75 (01/14 1231) Resp:  [16] 16 (01/14 1231) BP: (150)/(95) 150/95 (01/14 1231) SpO2:  [100 %] 100 % (01/14 1231) Weight:  [89.1 kg (196 lb 8 oz)] 89.1 kg (196 lb 8 oz) (01/14 1231) Physical Exam  Constitutional: He is oriented to person, place, and  time. He appears well-developed and well-nourished.  HENT:  Head: Normocephalic and atraumatic.  Eyes: EOM are normal. Pupils are equal, round, and reactive to light.  Neck: Normal range of motion. No thyromegaly present.  Cardiovascular: Normal rate and regular rhythm.  Respiratory: Effort normal. No respiratory distress.  GI: Soft. He exhibits no distension.  Musculoskeletal: Normal range of motion. He exhibits no edema.  Neurological: He is alert and oriented to person, place, and time.  Skin: Skin is warm and dry.  Psychiatric: He has a normal mood and affect. His behavior is normal. Judgment and thought content normal.    Laboratory Data:  No results found for this or any previous visit (from the past 24 hour(s)). No results found for this or any previous visit (from the past 240 hour(s)). Creatinine: No results for input(s): CREATININE in the last 168 hours. Baseline Creatinine: unknwon  Impression/Assessment:  76yo with BPH and incomplete emeptying  Plan:  The risks/benefits/alternatives to TURP was explained to the patient and he understands and wishes to proceed with surgery  Wilkie AyePatrick Audreana Hancox 10/17/2017, 2:30 PM

## 2017-10-17 NOTE — Anesthesia Preprocedure Evaluation (Addendum)
Anesthesia Evaluation  Patient identified by MRN, date of birth, ID band Patient awake    Reviewed: Allergy & Precautions, NPO status , Patient's Chart, lab work & pertinent test results  Airway Mallampati: II  TM Distance: >3 FB     Dental   Pulmonary former smoker,    breath sounds clear to auscultation       Cardiovascular negative cardio ROS   Rhythm:Regular Rate:Normal     Neuro/Psych    GI/Hepatic Neg liver ROS, GERD  ,  Endo/Other  Hypothyroidism   Renal/GU negative Renal ROS     Musculoskeletal   Abdominal   Peds  Hematology   Anesthesia Other Findings   Reproductive/Obstetrics                             Anesthesia Physical Anesthesia Plan  ASA: III  Anesthesia Plan: General   Post-op Pain Management:    Induction: Intravenous  PONV Risk Score and Plan: 1 and Treatment may vary due to age or medical condition and Ondansetron  Airway Management Planned: LMA  Additional Equipment:   Intra-op Plan:   Post-operative Plan: Extubation in OR  Informed Consent: I have reviewed the patients History and Physical, chart, labs and discussed the procedure including the risks, benefits and alternatives for the proposed anesthesia with the patient or authorized representative who has indicated his/her understanding and acceptance.   Dental advisory given  Plan Discussed with: CRNA and Anesthesiologist  Anesthesia Plan Comments:        Anesthesia Quick Evaluation

## 2017-10-17 NOTE — Anesthesia Procedure Notes (Signed)
Procedure Name: LMA Insertion Date/Time: 10/17/2017 3:01 PM Performed by: Jessica PriestBeeson, Arthur C, CRNA Pre-anesthesia Checklist: Patient identified, Emergency Drugs available, Suction available and Patient being monitored Patient Re-evaluated:Patient Re-evaluated prior to induction Oxygen Delivery Method: Circle system utilized Preoxygenation: Pre-oxygenation with 100% oxygen Induction Type: IV induction Ventilation: Mask ventilation without difficulty LMA: LMA inserted LMA Size: 4.0 Number of attempts: 1 Airway Equipment and Method: Bite block Placement Confirmation: positive ETCO2 and breath sounds checked- equal and bilateral Tube secured with: Tape Dental Injury: Teeth and Oropharynx as per pre-operative assessment

## 2017-10-17 NOTE — Anesthesia Postprocedure Evaluation (Signed)
Anesthesia Post Note  Patient: Arthur Beck  Procedure(s) Performed: TRANSURETHRAL RESECTION OF THE PROSTATE (TURP) (N/A )     Patient location during evaluation: PACU Anesthesia Type: MAC Level of consciousness: awake Pain management: pain level controlled Vital Signs Assessment: post-procedure vital signs reviewed and stable Respiratory status: spontaneous breathing Cardiovascular status: stable Anesthetic complications: no    Last Vitals:  Vitals:   10/17/17 1554 10/17/17 1600  BP: (!) 151/82 (!) 145/82  Pulse: 76 73  Resp: 10 11  Temp: 36.5 C   SpO2: 97% 96%    Last Pain:  Vitals:   10/17/17 1231  TempSrc: Oral                 [name]

## 2017-10-17 NOTE — Transfer of Care (Signed)
Immediate Anesthesia Transfer of Care Note  Patient: Arthur Beck  Procedure(s) Performed: Procedure(s) (LRB): TRANSURETHRAL RESECTION OF THE PROSTATE (TURP) (N/A)  Patient Location: PACU  Anesthesia Type: General  Level of Consciousness: awake, sedated, patient cooperative and responds to stimulation  Airway & Oxygen Therapy: Patient Spontanous Breathing and Patient connected to NCO2  Post-op Assessment: Report given to PACU RN, Post -op Vital signs reviewed and stable and Patient moving all extremities  Post vital signs: Reviewed and stable  Complications: No apparent anesthesia complications

## 2017-10-17 NOTE — Op Note (Signed)
Preoperative diagnosis: BPH  Postoperative diagnosis: BPH  Procedure: 1 cystoscopy 2. Transurethral resection of the prostate  Attending: Wilkie AyePatrick Aubery Douthat  Anesthesia: General  Estimated blood loss: Minimal  Drains: 22 French foley  Specimens: 1. Prostate Chips  Antibiotics: Rocephin  Findings: bilobar lateral lobe regrowth Ureteral orifices in normal anatomic location.   Indications: Patient is a 77 year old male with a history of BPH and elevated PVR. He was found to have regrowth of his lateral lobes on cystoscopy. After discussing treatment options, they decided proceed with transurethral resection of the prostate.  Procedure her in detail: The patient was brought to the operating room and a brief timeout was done to ensure correct patient, correct procedure, correct site.  General anesthesia was administered patient was placed in dorsal lithotomy position.  Their genitalia was then prepped and draped in usual sterile fashion.  A rigid 22 French cystoscope was passed in the urethra and the bladder.  Bladder was inspected and we noted no masses or lesions.  the ureteral orifices were in the normal orthotopic locations. removed the cystoscope and placed a resectoscope into the bladder. We then turned our attention to the prostate resection. Using the bipolar resectoscope we resected the median lobe first from the bladder neck to the verumontanum. We then started at the 12 oclock position on the left lobe and resection to the 6 o'clock position from the bladder neck to the verumontanum. We then did the same resection of the right lobe. Once the resection was complete we then cauterized individual bleeders. We then removed the prostate chips and sent them for pathology.  We then re-inspected the prostatic fossa and found no residual bleeding.  the bladder was then drained, a 22 French foley was placed and this concluded the procedure which was well tolerated by patient.  Complications:  None  Condition: Stable, extubated, transferred to PACU  Plan: Patient is admitted overnight with continuous bladder irrigation. If their urine is clear tomorrow they will be discharged home and followup in 5 days for foley catheter removal and pathology discussion.

## 2017-10-18 ENCOUNTER — Encounter (HOSPITAL_BASED_OUTPATIENT_CLINIC_OR_DEPARTMENT_OTHER): Payer: Self-pay | Admitting: Urology

## 2017-10-18 MED ORDER — HYDROCODONE-ACETAMINOPHEN 5-325 MG PO TABS: 1.0000 | ORAL_TABLET | ORAL | 0 refills | Status: DC | PRN

## 2017-10-18 NOTE — Addendum Note (Signed)
Addendum  created 10/18/17 2130 by Dorris SinghGreen, Caitlyn Buchanan, MD   Review and Sign - Signed, Sign clinical note

## 2017-10-18 NOTE — Discharge Instructions (Signed)
Transurethral Resection of the Prostate, Care After °Refer to this sheet in the next few weeks. These instructions provide you with information about caring for yourself after your procedure. Your health care provider may also give you more specific instructions. Your treatment has been planned according to current medical practices, but problems sometimes occur. Call your health care provider if you have any problems or questions after your procedure. °What can I expect after the procedure? °After the procedure, it is common to have: °· Mild pain in your lower abdomen. °· Soreness or mild discomfort in your penis from having the catheter inserted during the procedure. °· A feeling of urgency when you need to urinate. °· A small amount of blood in your urine. You may notice some small blood clots in your urine. These are normal. ° °Follow these instructions at home: °Medicines ° °· Take over-the-counter and prescription medicines only as told by your health care provider. °· Do not drive or operate heavy machinery while taking prescription pain medicine. °· Do not drive for 24 hours if you received a sedative. °· If you were prescribed antibiotic medicine, take it as told by your health care provider. Do not stop taking the antibiotic even if you start to feel better. °Activity °· Return to your normal activities as told by your health care provider. Ask your health care provider what activities are safe for you. °· Do not lift anything that is heavier than 10 lb (4.5 kg) for 3 weeks after your procedure, or as long as told by your health care provider. °· Avoid intense physical activity for as long as told by your health care provider. °· Walk at least one time every day. This helps to prevent blood clots. You may increase your physical activity gradually as you start to feel better. °Lifestyle °· Do not drink alcohol for as long as told by your health care provider. This is especially important if you are taking  prescription pain medicines. °· Do not engage in sexual activity until your health care provider says that you can do this. °General instructions °· Do not take baths, swim, or use a hot tub until your health care provider approves. °· Drink enough fluid to keep your urine clear or pale yellow. °· Urinate as soon as you feel the need to. Do not try to hold your urine for long periods of time. °· If your health care provider approves, you may take a stool softener for 2-3 weeks to prevent you from straining to have a bowel movement. °· Wear compression stockings as told by your health care provider. These stockings help to prevent blood clots and reduce swelling in your legs. °· Keep all follow-up visits as told by your health care provider. This is important. °Contact a health care provider if: °· You have difficulty urinating. °· You have a fever. °· You have pain that gets worse or does not improve with medicine. °· You have blood in your urine that does not go away after 1 week of resting and drinking more fluids. °· You have swelling in your penis or testicles. °Get help right away if: °· You are unable to urinate. °· You are having more blood clots in your urine instead of fewer. °· You have: °? Large blood clots. °? A lot of blood in your urine. °? Pain in your back or lower abdomen. °? Pain or swelling in your legs. °? Chills and you are shaking. °This information is not intended to   replace advice given to you by your health care provider. Make sure you discuss any questions you have with your health care provider. °Document Released: 09/20/2005 Document Revised: 05/23/2016 Document Reviewed: 06/12/2015 °Elsevier Interactive Patient Education © 2017 [employer] ° °

## 2017-10-20 LAB — POCT HEMOGLOBIN-HEMACUE: Hemoglobin: 14.2 g/dL (ref 13.0–17.0)

## 2017-10-27 NOTE — Discharge Summary (Signed)
Physician Discharge Summary  Patient ID: Arthur Beck MRN: [id number] DOB/AGE: 1941-08-29 77 y.o.  Admit date: 10/17/2017 Discharge date: 10/18/2017  Admission Diagnoses: BPH Discharge Diagnoses:  Active Problems:   BPH (benign prostatic hyperplasia)   Discharged Condition: good  Hospital Course: The patient tolerated the procedure well and was transferred to the floor on IV pain meds, IV fluid. On POD#1 pt was started on a regular diet and they ambulated in the halls.  Prior to discharge the pt was tolerating a regular diet, pain was controlled on PO pain meds, they were ambulating without difficulty, and they had normal bowel function.   Consults: None  Significant Diagnostic Studies: none  Treatments: surgery: TURP  Discharge Exam: Blood pressure 127/60, pulse 75, temperature 98.6 F (37 C), temperature source Oral, resp. rate 18, height 5\' 10"  (1.778 m), weight 89.1 kg (196 lb 8 oz), SpO2 94 %. General appearance: alert, cooperative and appears stated age Eyes: conjunctivae/corneas clear. PERRL, EOM's intact. Fundi benign. Nose: Nares normal. Septum midline. Mucosa normal. No drainage or sinus tenderness. Neck: no adenopathy, no carotid bruit, no JVD, supple, symmetrical, trachea midline and thyroid not enlarged, symmetric, no tenderness/mass/nodules Resp: clear to auscultation bilaterally Cardio: regular rate and rhythm, S1, S2 normal, no murmur, click, rub or gallop GI: soft, non-tender; bowel sounds normal; no masses,  no organomegaly Extremities: extremities normal, atraumatic, no cyanosis or edema Neurologic: Grossly normal  Disposition: 01-Home or Self Care  Discharge Instructions    Discharge patient   Complete by:  As directed    Discharge disposition:  01-Home or Self Care   Discharge patient date:  10/18/2017     Allergies as of 10/18/2017   No Known Allergies     Medication List    TAKE these medications   ADVIL 200 MG Caps Generic drug:   Ibuprofen Take by mouth every evening.   ALEVE 220 MG Caps Generic drug:  Naproxen Sodium Take by mouth daily.   aspirin 81 MG tablet Take 81 mg by mouth daily.   AZO BLADDER CONTROL/GO-LESS PO Take by mouth 2 (two) times daily.   HYDROcodone-acetaminophen 5-325 MG tablet Commonly known as:  NORCO/VICODIN Take 1-2 tablets by mouth every 4 (four) hours as needed for moderate pain.   levothyroxine 112 MCG tablet Commonly known as:  SYNTHROID, LEVOTHROID Take 112 mcg by mouth daily before breakfast.   omeprazole 40 MG capsule Commonly known as:  PRILOSEC Take 40 mg by mouth as needed.   OVER THE COUNTER MEDICATION Q/qrol 75 mg bid   PRESERVISION AREDS 2 Caps Take by mouth 2 (two) times daily.   Red Yeast Rice 600 MG Tabs Take by mouth 2 (two) times daily.   UNABLE TO FIND Vitamin d 3 125 units pm      Follow-up Information    Malen GauzeMcKenzie, Arali Somera L, MD. Call on 10/21/2017.   Specialty:  Urology Why:  voiding trial Contact information: [address] [place] [address] [phone]           Signed: Wilkie Ayeatrick Rielle Schlauch 10/27/2017, 2:04 PM

## 2017-11-08 DIAGNOSIS — R350 Frequency of micturition: Secondary | ICD-10-CM | POA: Diagnosis not present

## 2017-11-08 DIAGNOSIS — R3915 Urgency of urination: Secondary | ICD-10-CM | POA: Diagnosis not present

## 2017-11-18 DIAGNOSIS — M1990 Unspecified osteoarthritis, unspecified site: Secondary | ICD-10-CM | POA: Diagnosis not present

## 2017-11-18 DIAGNOSIS — G8929 Other chronic pain: Secondary | ICD-10-CM | POA: Diagnosis not present

## 2017-11-18 DIAGNOSIS — R32 Unspecified urinary incontinence: Secondary | ICD-10-CM | POA: Diagnosis not present

## 2017-11-18 DIAGNOSIS — M792 Neuralgia and neuritis, unspecified: Secondary | ICD-10-CM | POA: Diagnosis not present

## 2017-11-18 DIAGNOSIS — H547 Unspecified visual loss: Secondary | ICD-10-CM | POA: Diagnosis not present

## 2017-11-18 DIAGNOSIS — K219 Gastro-esophageal reflux disease without esophagitis: Secondary | ICD-10-CM | POA: Diagnosis not present

## 2017-11-18 DIAGNOSIS — Z8249 Family history of ischemic heart disease and other diseases of the circulatory system: Secondary | ICD-10-CM | POA: Diagnosis not present

## 2017-11-18 DIAGNOSIS — Z791 Long term (current) use of non-steroidal anti-inflammatories (NSAID): Secondary | ICD-10-CM | POA: Diagnosis not present

## 2017-11-18 DIAGNOSIS — E039 Hypothyroidism, unspecified: Secondary | ICD-10-CM | POA: Diagnosis not present

## 2017-11-18 DIAGNOSIS — Z7722 Contact with and (suspected) exposure to environmental tobacco smoke (acute) (chronic): Secondary | ICD-10-CM | POA: Diagnosis not present

## 2017-12-06 DIAGNOSIS — R350 Frequency of micturition: Secondary | ICD-10-CM | POA: Diagnosis not present

## 2017-12-06 DIAGNOSIS — R3915 Urgency of urination: Secondary | ICD-10-CM | POA: Diagnosis not present

## 2018-01-03 DIAGNOSIS — R3915 Urgency of urination: Secondary | ICD-10-CM | POA: Diagnosis not present

## 2018-01-26 DIAGNOSIS — N401 Enlarged prostate with lower urinary tract symptoms: Secondary | ICD-10-CM | POA: Diagnosis not present

## 2018-01-26 DIAGNOSIS — N3000 Acute cystitis without hematuria: Secondary | ICD-10-CM | POA: Diagnosis not present

## 2018-01-26 DIAGNOSIS — R351 Nocturia: Secondary | ICD-10-CM | POA: Diagnosis not present

## 2018-01-31 DIAGNOSIS — R3915 Urgency of urination: Secondary | ICD-10-CM | POA: Diagnosis not present

## 2018-02-22 DIAGNOSIS — H04123 Dry eye syndrome of bilateral lacrimal glands: Secondary | ICD-10-CM | POA: Diagnosis not present

## 2018-02-22 DIAGNOSIS — H35371 Puckering of macula, right eye: Secondary | ICD-10-CM | POA: Diagnosis not present

## 2018-02-22 DIAGNOSIS — H40012 Open angle with borderline findings, low risk, left eye: Secondary | ICD-10-CM | POA: Diagnosis not present

## 2018-02-22 DIAGNOSIS — H18413 Arcus senilis, bilateral: Secondary | ICD-10-CM | POA: Diagnosis not present

## 2018-02-22 DIAGNOSIS — H353131 Nonexudative age-related macular degeneration, bilateral, early dry stage: Secondary | ICD-10-CM | POA: Diagnosis not present

## 2018-02-22 DIAGNOSIS — H0100B Unspecified blepharitis left eye, upper and lower eyelids: Secondary | ICD-10-CM | POA: Diagnosis not present

## 2018-02-22 DIAGNOSIS — H11153 Pinguecula, bilateral: Secondary | ICD-10-CM | POA: Diagnosis not present

## 2018-02-22 DIAGNOSIS — H16223 Keratoconjunctivitis sicca, not specified as Sjogren's, bilateral: Secondary | ICD-10-CM | POA: Diagnosis not present

## 2018-02-22 DIAGNOSIS — H0100A Unspecified blepharitis right eye, upper and lower eyelids: Secondary | ICD-10-CM | POA: Diagnosis not present

## 2018-02-22 DIAGNOSIS — H11423 Conjunctival edema, bilateral: Secondary | ICD-10-CM | POA: Diagnosis not present

## 2018-02-28 DIAGNOSIS — R3915 Urgency of urination: Secondary | ICD-10-CM | POA: Diagnosis not present

## 2018-02-28 DIAGNOSIS — R350 Frequency of micturition: Secondary | ICD-10-CM | POA: Diagnosis not present

## 2018-03-16 DIAGNOSIS — R351 Nocturia: Secondary | ICD-10-CM | POA: Diagnosis not present

## 2018-03-16 DIAGNOSIS — N401 Enlarged prostate with lower urinary tract symptoms: Secondary | ICD-10-CM | POA: Diagnosis not present

## 2018-03-16 DIAGNOSIS — N3000 Acute cystitis without hematuria: Secondary | ICD-10-CM | POA: Diagnosis not present

## 2018-03-16 DIAGNOSIS — N411 Chronic prostatitis: Secondary | ICD-10-CM | POA: Diagnosis not present

## 2018-04-10 DIAGNOSIS — M6702 Short Achilles tendon (acquired), left ankle: Secondary | ICD-10-CM | POA: Diagnosis not present

## 2018-04-10 DIAGNOSIS — M79672 Pain in left foot: Secondary | ICD-10-CM | POA: Diagnosis not present

## 2018-04-10 DIAGNOSIS — M722 Plantar fascial fibromatosis: Secondary | ICD-10-CM | POA: Diagnosis not present

## 2018-05-01 DIAGNOSIS — M79672 Pain in left foot: Secondary | ICD-10-CM | POA: Diagnosis not present

## 2018-05-01 DIAGNOSIS — M6702 Short Achilles tendon (acquired), left ankle: Secondary | ICD-10-CM | POA: Diagnosis not present

## 2018-05-01 DIAGNOSIS — M722 Plantar fascial fibromatosis: Secondary | ICD-10-CM | POA: Diagnosis not present

## 2018-05-04 DIAGNOSIS — R351 Nocturia: Secondary | ICD-10-CM | POA: Diagnosis not present

## 2018-05-04 DIAGNOSIS — N401 Enlarged prostate with lower urinary tract symptoms: Secondary | ICD-10-CM | POA: Diagnosis not present

## 2018-05-15 DIAGNOSIS — M722 Plantar fascial fibromatosis: Secondary | ICD-10-CM | POA: Diagnosis not present

## 2018-05-19 DIAGNOSIS — M79672 Pain in left foot: Secondary | ICD-10-CM | POA: Diagnosis not present

## 2018-05-19 DIAGNOSIS — M25572 Pain in left ankle and joints of left foot: Secondary | ICD-10-CM | POA: Diagnosis not present

## 2018-05-19 DIAGNOSIS — M722 Plantar fascial fibromatosis: Secondary | ICD-10-CM | POA: Diagnosis not present

## 2018-06-02 DIAGNOSIS — M722 Plantar fascial fibromatosis: Secondary | ICD-10-CM | POA: Diagnosis not present

## 2018-06-02 DIAGNOSIS — M79672 Pain in left foot: Secondary | ICD-10-CM | POA: Diagnosis not present

## 2018-06-02 DIAGNOSIS — M25572 Pain in left ankle and joints of left foot: Secondary | ICD-10-CM | POA: Diagnosis not present

## 2018-06-16 DIAGNOSIS — M79672 Pain in left foot: Secondary | ICD-10-CM | POA: Diagnosis not present

## 2018-06-16 DIAGNOSIS — M722 Plantar fascial fibromatosis: Secondary | ICD-10-CM | POA: Diagnosis not present

## 2018-06-16 DIAGNOSIS — M25572 Pain in left ankle and joints of left foot: Secondary | ICD-10-CM | POA: Diagnosis not present

## 2018-07-25 DIAGNOSIS — E039 Hypothyroidism, unspecified: Secondary | ICD-10-CM | POA: Diagnosis not present

## 2018-07-25 DIAGNOSIS — Z23 Encounter for immunization: Secondary | ICD-10-CM | POA: Diagnosis not present

## 2018-07-25 DIAGNOSIS — E7800 Pure hypercholesterolemia, unspecified: Secondary | ICD-10-CM | POA: Diagnosis not present

## 2018-07-25 DIAGNOSIS — E559 Vitamin D deficiency, unspecified: Secondary | ICD-10-CM | POA: Diagnosis not present

## 2018-07-25 DIAGNOSIS — K219 Gastro-esophageal reflux disease without esophagitis: Secondary | ICD-10-CM | POA: Diagnosis not present

## 2018-07-25 DIAGNOSIS — M1990 Unspecified osteoarthritis, unspecified site: Secondary | ICD-10-CM | POA: Diagnosis not present

## 2018-07-25 DIAGNOSIS — Z0000 Encounter for general adult medical examination without abnormal findings: Secondary | ICD-10-CM | POA: Diagnosis not present

## 2018-08-10 DIAGNOSIS — N401 Enlarged prostate with lower urinary tract symptoms: Secondary | ICD-10-CM | POA: Diagnosis not present

## 2018-08-10 DIAGNOSIS — R351 Nocturia: Secondary | ICD-10-CM | POA: Diagnosis not present

## 2020-10-16 DIAGNOSIS — M25511 Pain in right shoulder: Secondary | ICD-10-CM | POA: Diagnosis not present

## 2020-10-27 DIAGNOSIS — M25511 Pain in right shoulder: Secondary | ICD-10-CM | POA: Diagnosis not present

## 2020-10-27 DIAGNOSIS — M7541 Impingement syndrome of right shoulder: Secondary | ICD-10-CM | POA: Diagnosis not present

## 2020-10-30 DIAGNOSIS — M25511 Pain in right shoulder: Secondary | ICD-10-CM | POA: Diagnosis not present

## 2020-10-30 DIAGNOSIS — M7541 Impingement syndrome of right shoulder: Secondary | ICD-10-CM | POA: Diagnosis not present

## 2020-11-03 DIAGNOSIS — M25511 Pain in right shoulder: Secondary | ICD-10-CM | POA: Diagnosis not present

## 2020-11-03 DIAGNOSIS — M7541 Impingement syndrome of right shoulder: Secondary | ICD-10-CM | POA: Diagnosis not present

## 2020-11-05 DIAGNOSIS — M25511 Pain in right shoulder: Secondary | ICD-10-CM | POA: Diagnosis not present

## 2020-11-05 DIAGNOSIS — M7541 Impingement syndrome of right shoulder: Secondary | ICD-10-CM | POA: Diagnosis not present

## 2020-11-10 DIAGNOSIS — M7541 Impingement syndrome of right shoulder: Secondary | ICD-10-CM | POA: Diagnosis not present

## 2020-11-10 DIAGNOSIS — M25511 Pain in right shoulder: Secondary | ICD-10-CM | POA: Diagnosis not present

## 2020-11-13 DIAGNOSIS — M25511 Pain in right shoulder: Secondary | ICD-10-CM | POA: Diagnosis not present

## 2020-11-13 DIAGNOSIS — M7541 Impingement syndrome of right shoulder: Secondary | ICD-10-CM | POA: Diagnosis not present

## 2020-11-20 DIAGNOSIS — M7541 Impingement syndrome of right shoulder: Secondary | ICD-10-CM | POA: Diagnosis not present

## 2020-11-20 DIAGNOSIS — M25511 Pain in right shoulder: Secondary | ICD-10-CM | POA: Diagnosis not present

## 2020-11-25 DIAGNOSIS — M25511 Pain in right shoulder: Secondary | ICD-10-CM | POA: Diagnosis not present

## 2020-11-25 DIAGNOSIS — M7541 Impingement syndrome of right shoulder: Secondary | ICD-10-CM | POA: Diagnosis not present

## 2020-11-27 DIAGNOSIS — M25511 Pain in right shoulder: Secondary | ICD-10-CM | POA: Diagnosis not present

## 2020-11-27 DIAGNOSIS — M7541 Impingement syndrome of right shoulder: Secondary | ICD-10-CM | POA: Diagnosis not present

## 2020-12-01 DIAGNOSIS — M25511 Pain in right shoulder: Secondary | ICD-10-CM | POA: Diagnosis not present

## 2020-12-01 DIAGNOSIS — M7541 Impingement syndrome of right shoulder: Secondary | ICD-10-CM | POA: Diagnosis not present

## 2020-12-02 DIAGNOSIS — H903 Sensorineural hearing loss, bilateral: Secondary | ICD-10-CM | POA: Diagnosis not present

## 2020-12-04 DIAGNOSIS — M7541 Impingement syndrome of right shoulder: Secondary | ICD-10-CM | POA: Diagnosis not present

## 2020-12-04 DIAGNOSIS — M25511 Pain in right shoulder: Secondary | ICD-10-CM | POA: Diagnosis not present

## 2020-12-10 DIAGNOSIS — M25511 Pain in right shoulder: Secondary | ICD-10-CM | POA: Diagnosis not present

## 2021-05-19 DIAGNOSIS — H35033 Hypertensive retinopathy, bilateral: Secondary | ICD-10-CM | POA: Diagnosis not present

## 2021-05-19 DIAGNOSIS — H0102B Squamous blepharitis left eye, upper and lower eyelids: Secondary | ICD-10-CM | POA: Diagnosis not present

## 2021-05-19 DIAGNOSIS — Z961 Presence of intraocular lens: Secondary | ICD-10-CM | POA: Diagnosis not present

## 2021-05-19 DIAGNOSIS — H40013 Open angle with borderline findings, low risk, bilateral: Secondary | ICD-10-CM | POA: Diagnosis not present

## 2021-05-19 DIAGNOSIS — H35371 Puckering of macula, right eye: Secondary | ICD-10-CM | POA: Diagnosis not present

## 2021-05-19 DIAGNOSIS — H353131 Nonexudative age-related macular degeneration, bilateral, early dry stage: Secondary | ICD-10-CM | POA: Diagnosis not present

## 2021-05-19 DIAGNOSIS — H0102A Squamous blepharitis right eye, upper and lower eyelids: Secondary | ICD-10-CM | POA: Diagnosis not present

## 2021-05-19 DIAGNOSIS — H16223 Keratoconjunctivitis sicca, not specified as Sjogren's, bilateral: Secondary | ICD-10-CM | POA: Diagnosis not present

## 2021-09-25 DIAGNOSIS — M25551 Pain in right hip: Secondary | ICD-10-CM | POA: Diagnosis not present

## 2021-09-25 DIAGNOSIS — M545 Low back pain, unspecified: Secondary | ICD-10-CM | POA: Diagnosis not present

## 2021-09-30 DIAGNOSIS — M1990 Unspecified osteoarthritis, unspecified site: Secondary | ICD-10-CM | POA: Diagnosis not present

## 2021-09-30 DIAGNOSIS — E039 Hypothyroidism, unspecified: Secondary | ICD-10-CM | POA: Diagnosis not present

## 2021-09-30 DIAGNOSIS — K219 Gastro-esophageal reflux disease without esophagitis: Secondary | ICD-10-CM | POA: Diagnosis not present

## 2021-09-30 DIAGNOSIS — E7800 Pure hypercholesterolemia, unspecified: Secondary | ICD-10-CM | POA: Diagnosis not present

## 2021-09-30 DIAGNOSIS — Z0000 Encounter for general adult medical examination without abnormal findings: Secondary | ICD-10-CM | POA: Diagnosis not present

## 2021-10-07 DIAGNOSIS — M545 Low back pain, unspecified: Secondary | ICD-10-CM | POA: Diagnosis not present

## 2021-10-07 DIAGNOSIS — M5416 Radiculopathy, lumbar region: Secondary | ICD-10-CM | POA: Diagnosis not present

## 2021-10-13 DIAGNOSIS — M545 Low back pain, unspecified: Secondary | ICD-10-CM | POA: Diagnosis not present

## 2021-10-13 DIAGNOSIS — M5416 Radiculopathy, lumbar region: Secondary | ICD-10-CM | POA: Diagnosis not present

## 2021-10-15 DIAGNOSIS — M5416 Radiculopathy, lumbar region: Secondary | ICD-10-CM | POA: Diagnosis not present

## 2021-10-15 DIAGNOSIS — M545 Low back pain, unspecified: Secondary | ICD-10-CM | POA: Diagnosis not present

## 2021-10-19 DIAGNOSIS — M5416 Radiculopathy, lumbar region: Secondary | ICD-10-CM | POA: Diagnosis not present

## 2021-10-19 DIAGNOSIS — M545 Low back pain, unspecified: Secondary | ICD-10-CM | POA: Diagnosis not present

## 2021-10-22 DIAGNOSIS — M5416 Radiculopathy, lumbar region: Secondary | ICD-10-CM | POA: Diagnosis not present

## 2021-10-22 DIAGNOSIS — M545 Low back pain, unspecified: Secondary | ICD-10-CM | POA: Diagnosis not present

## 2021-10-28 DIAGNOSIS — M5416 Radiculopathy, lumbar region: Secondary | ICD-10-CM | POA: Diagnosis not present

## 2021-10-28 DIAGNOSIS — M545 Low back pain, unspecified: Secondary | ICD-10-CM | POA: Diagnosis not present

## 2021-10-29 DIAGNOSIS — M5416 Radiculopathy, lumbar region: Secondary | ICD-10-CM | POA: Diagnosis not present

## 2021-10-29 DIAGNOSIS — M545 Low back pain, unspecified: Secondary | ICD-10-CM | POA: Diagnosis not present

## 2021-11-02 DIAGNOSIS — M545 Low back pain, unspecified: Secondary | ICD-10-CM | POA: Diagnosis not present

## 2021-11-02 DIAGNOSIS — M5416 Radiculopathy, lumbar region: Secondary | ICD-10-CM | POA: Diagnosis not present

## 2021-11-04 DIAGNOSIS — M5416 Radiculopathy, lumbar region: Secondary | ICD-10-CM | POA: Diagnosis not present

## 2021-11-04 DIAGNOSIS — M545 Low back pain, unspecified: Secondary | ICD-10-CM | POA: Diagnosis not present

## 2021-11-09 DIAGNOSIS — M545 Low back pain, unspecified: Secondary | ICD-10-CM | POA: Diagnosis not present

## 2021-11-09 DIAGNOSIS — M5416 Radiculopathy, lumbar region: Secondary | ICD-10-CM | POA: Diagnosis not present

## 2021-11-11 DIAGNOSIS — M545 Low back pain, unspecified: Secondary | ICD-10-CM | POA: Diagnosis not present

## 2021-11-11 DIAGNOSIS — M5416 Radiculopathy, lumbar region: Secondary | ICD-10-CM | POA: Diagnosis not present

## 2021-11-16 DIAGNOSIS — M5416 Radiculopathy, lumbar region: Secondary | ICD-10-CM | POA: Diagnosis not present

## 2021-11-16 DIAGNOSIS — M545 Low back pain, unspecified: Secondary | ICD-10-CM | POA: Diagnosis not present

## 2021-11-18 DIAGNOSIS — M5459 Other low back pain: Secondary | ICD-10-CM | POA: Diagnosis not present

## 2021-11-20 DIAGNOSIS — M5451 Vertebrogenic low back pain: Secondary | ICD-10-CM | POA: Diagnosis not present

## 2021-12-01 DIAGNOSIS — M5416 Radiculopathy, lumbar region: Secondary | ICD-10-CM | POA: Diagnosis not present

## 2021-12-15 DIAGNOSIS — M5451 Vertebrogenic low back pain: Secondary | ICD-10-CM | POA: Diagnosis not present

## 2021-12-29 DIAGNOSIS — M5416 Radiculopathy, lumbar region: Secondary | ICD-10-CM | POA: Diagnosis not present

## 2022-01-06 DIAGNOSIS — M5451 Vertebrogenic low back pain: Secondary | ICD-10-CM | POA: Diagnosis not present

## 2022-02-09 ENCOUNTER — Encounter: Payer: Self-pay | Admitting: *Deleted

## 2022-02-12 ENCOUNTER — Ambulatory Visit: Payer: Medicare Other | Admitting: Diagnostic Neuroimaging

## 2022-02-12 ENCOUNTER — Encounter: Payer: Self-pay | Admitting: Diagnostic Neuroimaging

## 2022-02-12 VITALS — BP 149/87 | HR 67 | Ht 69.0 in | Wt 184.2 lb

## 2022-02-12 DIAGNOSIS — R200 Anesthesia of skin: Secondary | ICD-10-CM | POA: Diagnosis not present

## 2022-02-12 DIAGNOSIS — M79604 Pain in right leg: Secondary | ICD-10-CM | POA: Diagnosis not present

## 2022-02-12 NOTE — Progress Notes (Signed)
? ?[facility] ? ?PATIENT: Arthur Beck ?DOB: [birth year] ? ?REFERRING CLINICIAN: Jene Every, MD ?HISTORY FROM: patient  ?REASON FOR VISIT: new consult ? ? ?HISTORICAL ? ?CHIEF COMPLAINT:  ?Chief Complaint  ?Patient presents with  ? New Patient (Initial Visit)  ?  Pt in 7  pt is here for Radiculopathy pain . Pt states that he has lower back pain  pt states the pain travels to right leg   ? ? ?HISTORY OF PRESENT ILLNESS:  ? ?81 year old male here for evaluation of low back pain rating to the right leg.  Patient has had these issues for several years.  Symptoms worsening more recently.  He describes low back pain that radiates to his right hip, right thigh, back of his calf and into his foot.  Symptoms are worse when he is standing and walking or active they are slightly relieved with less activity. ? ?Patient went to orthopedic clinic for evaluation and had MRI lumbar spine which showed possible degenerative changes.  EMG was performed which showed some abnormality right tibial motor response and therefore possibility of neuropathy was raised. ? ?Patient denies any symptoms in left leg or hands.  Patient referred here for neuropathy evaluation. ? ? ?REVIEW OF SYSTEMS: Full 14 system review of systems performed and negative with exception of: as per HPI. ? ?ALLERGIES: ?No Known Allergies ? ?HOME MEDICATIONS: ?Outpatient Medications Prior to Visit  ?Medication Sig Dispense Refill  ? Ibuprofen 200 MG CAPS Take by mouth every evening.    ? levothyroxine (SYNTHROID, LEVOTHROID) 112 MCG tablet Take 112 mcg by mouth daily before breakfast.    ? Multiple Vitamins-Minerals (PRESERVISION AREDS 2) CAPS Take by mouth 2 (two) times daily.    ? Naproxen Sodium 220 MG CAPS Take by mouth daily.    ? omeprazole (PRILOSEC) 40 MG capsule Take 40 mg by mouth as needed.    ? OVER THE COUNTER MEDICATION Q/qrol 75 mg bid    ? Pumpkin Seed-Soy Germ (AZO BLADDER CONTROL/GO-LESS PO) Take by mouth 2 (two) times daily.     ? Red Yeast Rice 600 MG TABS Take by mouth 2 (two) times daily.    ? UNABLE TO FIND Vitamin d 3 125 units pm    ? aspirin 81 MG tablet Take 81 mg by mouth daily.    ? HYDROcodone-acetaminophen (NORCO/VICODIN) 5-325 MG tablet Take 1-2 tablets by mouth every 4 (four) hours as needed for moderate pain. 30 tablet 0  ? ?No facility-administered medications prior to visit.  ? ? ?PAST MEDICAL HISTORY: ?Past Medical History:  ?Diagnosis Date  ? Arthritis   ? BPH (benign prostatic hyperplasia)   ? Headache   ? Hyperlipidemia   ? Mild  ? Hypothyroid   ? Peripheral neuropathy   ? Radiculopathy   ? Reflux   ? ? ?PAST SURGICAL HISTORY: ?Past Surgical History:  ?Procedure Laterality Date  ? INGUINAL HERNIA REPAIR    ? left  ? KNEE ARTHROSCOPY    ? left  ? PROSTATE SURGERY  2010  ? TUNA  ? SHOULDER SURGERY    ? right  ? TRANSURETHRAL RESECTION OF PROSTATE N/A 10/17/2017  ? Procedure: TRANSURETHRAL RESECTION OF THE PROSTATE (TURP);  Surgeon: Malen Gauze, MD;  Location: [facility];  Service: Urology;  Laterality: N/A;  ? ? ?FAMILY HISTORY: ?Family History  ?Problem Relation Age of Onset  ? Alzheimer's disease Mother   ? Kidney disease Father   ? Peripheral vascular disease Brother 75  ?  Diabetes Brother   ?     Died age 80  ? Neuropathy Neg Hx   ? ? ?SOCIAL HISTORY: ?Social History  ? ?Socioeconomic History  ? Marital status: Married  ?  Spouse name: Not on file  ? Number of children: 2  ? Years of education: 7212  ? Highest education level: Not on file  ?Occupational History  ?  Employer: [employer]   ?Tobacco Use  ? Smoking status: Former  ?  Years: 0.50  ?  Types: Cigarettes  ? Smokeless tobacco: Current  ?  Types: Snuff  ? Tobacco comments:  ?  Quit 50 years ago  ?Vaping Use  ? Vaping Use: Never used  ?Substance and Sexual Activity  ? Alcohol use: No  ? Drug use: No  ? Sexual activity: Not on file  ?Other Topics Concern  ? Not on file  ?Social History Narrative  ? 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   ? ?Social Determinants of Health  ? ?Financial Resource Strain: Not on file  ?Food Insecurity: Not on file  ?Transportation Needs: Not on file  ?Physical Activity: Not on file  ?Stress: Not on file  ?Social Connections: Not on file  ?Intimate Partner Violence: Not on file  ? ? ? ?PHYSICAL EXAM ? ?GENERAL EXAM/CONSTITUTIONAL: ?Vitals:  ?Vitals:  ? 02/12/22 0927  ?BP: (!) 149/87  ?Pulse: 67  ?Weight: 184 lb 3.2 oz (83.6 kg)  ?Height: 5\' 9"  (1.753 m)  ? ?Body mass index is 27.2 kg/m?. ?Wt Readings from Last 3 Encounters:  ?02/12/22 184 lb 3.2 oz (83.6 kg)  ?10/17/17 196 lb 8 oz (89.1 kg)  ?10/09/15 197 lb (89.4 kg)  ? ?Patient is in no distress; well developed, nourished and groomed; neck is supple ? ?CARDIOVASCULAR: ?Examination of carotid arteries is normal; no carotid bruits ?Regular rate and rhythm, no murmurs ?Examination of peripheral vascular system by observation and palpation is normal ? ?EYES: ?Ophthalmoscopic exam of optic discs and posterior segments is normal; no papilledema or hemorrhages ?No results found. ? ?MUSCULOSKELETAL: ?Gait, strength, tone, movements noted in Neurologic exam below ? ?NEUROLOGIC: ?MENTAL STATUS:  ?   ? View : No data to display.  ?  ?  ?  ? ?awake, alert, oriented to person, place and time ?recent and remote memory intact ?normal attention and concentration ?language fluent, comprehension intact, naming intact ?fund of knowledge appropriate ? ?CRANIAL NERVE:  ?2nd - no papilledema on fundoscopic exam ?2nd, 3rd, 4th, 6th - pupils equal and reactive to light, visual fields full to confrontation, extraocular muscles intact, no nystagmus ?5th - facial sensation symmetric ?7th - facial strength symmetric ?8th - hearing intact ?9th - palate elevates symmetrically, uvula midline ?11th - shoulder shrug symmetric ?12th - tongue protrusion midline ? ?MOTOR:  ?normal bulk and tone, full strength in the BUE, BLE ? ?SENSORY:  ?normal and symmetric to light touch, temperature,  vibration ? ?COORDINATION:  ?finger-nose-finger, fine finger movements normal ? ?REFLEXES:  ?deep tendon reflexes TRACE and symmetric ? ?GAIT/STATION:  ?narrow based gait ? ? ? ? ?DIAGNOSTIC DATA (LABS, IMAGING, TESTING) ?- I reviewed patient records, labs, notes, testing and imaging myself where available. ? ?Lab Results  ?Component Value Date  ? WBC 6.9 09/23/2006  ? HGB 14.2 10/17/2017  ? HCT 44.6 09/23/2006  ? MCV 92.1 09/23/2006  ? PLT 302 09/23/2006  ? ?   ?Component Value Date/Time  ? NA 138 09/23/2006 0745  ? K  4.5 09/23/2006 0745  ? CL 106 09/23/2006 0745  ? CO2 25 09/23/2006 0745  ? GLUCOSE 107 (H) 09/23/2006 0745  ? BUN 10 09/23/2006 0745  ? CREATININE 1.1 09/23/2006 0745  ? CALCIUM 9.3 09/23/2006 0745  ? PROT 6.9 09/23/2006 0745  ? ALBUMIN 4.0 09/23/2006 0745  ? AST 23 09/23/2006 0745  ? ALT 20 09/23/2006 0745  ? ALKPHOS 46 09/23/2006 0745  ? BILITOT 1.6 (H) 09/23/2006 0745  ? GFRNONAA 72 09/23/2006 0745  ? ?Lab Results  ?Component Value Date  ? CHOL 160 11/28/2006  ? HDL 35.3 (L) 11/28/2006  ? LDLCALC 107 (H) 11/28/2006  ? TRIG 89 11/28/2006  ? CHOLHDL 4.5 CALC 11/28/2006  ? ?No results found for: HGBA1C ?No results found for: VITAMINB12 ?Lab Results  ?Component Value Date  ? TSH 4.77 09/23/2006  ? ? ?11/19/21 MRI lumbar spine [also I reviewed images myself. 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 ? ? ? ?ASSESSMENT AND PLAN ? ?81 y.o. year old male here with: ? ? ?Dx: ? ?1. Right leg pain   ?2. Right leg numbness   ? ? ? ? ?PLAN: ? ?RIGHT LEG EMG/NCS findings ?-Outside study reviewed--> isolated prolonged right tibial motor latency; otherwise normal sural response and remainder of study; given clinical context, suspect this finding is more related to a right S1 radiculopathy, not underlying distal neuropathy, even though could not be proved via needle EMG;  alternatively this finding could be related to technical artifact if the patient's limb temperature was suboptimal ? ?- left leg was not checked to see if underlying widespread neuropathy is present; no sign of

## 2022-02-17 LAB — CBC WITH DIFFERENTIAL/PLATELET
Basophils Absolute: 0.1 10*3/uL (ref 0.0–0.2)
Basos: 1 %
EOS (ABSOLUTE): 0.1 10*3/uL (ref 0.0–0.4)
Eos: 1 %
Hematocrit: 40.7 % (ref 37.5–51.0)
Hemoglobin: 13.5 g/dL (ref 13.0–17.7)
Immature Grans (Abs): 0 10*3/uL (ref 0.0–0.1)
Immature Granulocytes: 0 %
Lymphocytes Absolute: 1.2 10*3/uL (ref 0.7–3.1)
Lymphs: 17 %
MCH: 30.7 pg (ref 26.6–33.0)
MCHC: 33.2 g/dL (ref 31.5–35.7)
MCV: 93 fL (ref 79–97)
Monocytes Absolute: 0.6 10*3/uL (ref 0.1–0.9)
Monocytes: 8 %
Neutrophils Absolute: 5.5 10*3/uL (ref 1.4–7.0)
Neutrophils: 73 %
Platelets: 280 10*3/uL (ref 150–450)
RBC: 4.4 x10E6/uL (ref 4.14–5.80)
RDW: 12.5 % (ref 11.6–15.4)
WBC: 7.5 10*3/uL (ref 3.4–10.8)

## 2022-02-17 LAB — COMPREHENSIVE METABOLIC PANEL
ALT: 27 IU/L (ref 0–44)
AST: 29 IU/L (ref 0–40)
Albumin/Globulin Ratio: 1.6 (ref 1.2–2.2)
Albumin: 4.5 g/dL (ref 3.7–4.7)
Alkaline Phosphatase: 51 IU/L (ref 44–121)
BUN/Creatinine Ratio: 21 (ref 10–24)
BUN: 24 mg/dL (ref 8–27)
Bilirubin Total: 0.5 mg/dL (ref 0.0–1.2)
CO2: 22 mmol/L (ref 20–29)
Calcium: 9.9 mg/dL (ref 8.6–10.2)
Chloride: 104 mmol/L (ref 96–106)
Creatinine, Ser: 1.12 mg/dL (ref 0.76–1.27)
Globulin, Total: 2.9 g/dL (ref 1.5–4.5)
Glucose: 93 mg/dL (ref 70–99)
Potassium: 5 mmol/L (ref 3.5–5.2)
Sodium: 141 mmol/L (ref 134–144)
Total Protein: 7.4 g/dL (ref 6.0–8.5)
eGFR: 66 mL/min/{1.73_m2} (ref >59)

## 2022-02-17 LAB — FANA STAINING PATTERNS: Speckled Pattern: 1:640 {titer} — ABNORMAL HIGH

## 2022-02-17 LAB — MULTIPLE MYELOMA PANEL, SERUM
Albumin SerPl Elph-Mcnc: 4.3 g/dL (ref 2.9–4.4)
Albumin/Glob SerPl: 1.4 (ref 0.7–1.7)
Alpha 1: 0.3 g/dL (ref 0.0–0.4)
Alpha2 Glob SerPl Elph-Mcnc: 0.9 g/dL (ref 0.4–1.0)
B-Globulin SerPl Elph-Mcnc: 1.2 g/dL (ref 0.7–1.3)
Gamma Glob SerPl Elph-Mcnc: 0.8 g/dL (ref 0.4–1.8)
Globulin, Total: 3.1 g/dL (ref 2.2–3.9)
IgA/Immunoglobulin A, Serum: 400 mg/dL (ref 61–437)
IgG (Immunoglobin G), Serum: 873 mg/dL (ref 603–1613)
IgM (Immunoglobulin M), Srm: 32 mg/dL (ref 15–143)

## 2022-02-17 LAB — VITAMIN B12: Vitamin B-12: 608 pg/mL (ref 232–1245)

## 2022-02-17 LAB — TSH: TSH: 4.01 u[IU]/mL (ref 0.450–4.500)

## 2022-02-17 LAB — HEMOGLOBIN A1C
Est. average glucose Bld gHb Est-mCnc: 123 mg/dL
Hgb A1c MFr Bld: 5.9 % — ABNORMAL HIGH (ref 4.8–5.6)

## 2022-02-17 LAB — ANA,IFA RA DIAG PNL W/RFLX TIT/PATN
ANA Titer 1: POSITIVE — AB
Cyclic Citrullin Peptide Ab: 7 units (ref 0–19)
Rhuematoid fact SerPl-aCnc: <10 IU/mL (ref <14.0)

## 2022-02-22 ENCOUNTER — Telehealth: Payer: Self-pay

## 2022-02-22 NOTE — Telephone Encounter (Signed)
I called the pt and we discussed results. He verbalized understanding but would like to discuss with PCP first and then let us know about the Rheumatology referral. Will call back after his appt on 02/25/2022.

## 2022-02-22 NOTE — Telephone Encounter (Signed)
-----   Message from Suanne Marker, MD sent at 02/18/2022  6:04 PM EDT ----- Positive ANA and speckled pattern. Could be related to underlying arthritis / pain issues. Unclear if related to mild neuropathy findings. Could consider rheumatology consult for further evaluation. -VRP

## 2022-02-25 DIAGNOSIS — M5451 Vertebrogenic low back pain: Secondary | ICD-10-CM | POA: Diagnosis not present

## 2022-02-26 NOTE — Telephone Encounter (Addendum)
Pt came in very upset stating that his lab results and OV notes were not sent to Dr. Jene Every as he had requested. He states that he had an appointment with Dr. Shelle Iron yesterday that was "useless" because he was unable to see his test results. He is upset that he made the trip there and paid for a visit that was not helpful to him. He would like his results sent to Dr. Shelle Iron and a phone call stating that this has been done. Informed pt that we were closed on Monday for the holiday and the soonest we could get them sent is Tuesday 05/30. He would like a call back at [phone].

## 2022-03-02 NOTE — Telephone Encounter (Signed)
I called pt back and dicussed message. Pt requesting for labs to be sent to his Orthopedic doctor Dr. Shelle Iron-  I have sent to # [phone], confirmation received.

## 2022-03-02 NOTE — Telephone Encounter (Signed)
On 02/18/2022 Dr. Marjory Lies electronically sent all the labs to pt's PCP... this is why an additional copy was not sent. I have sent a paper copy to fax # [phone] this morning and received confirmation will call pt later on today updating on this.

## 2022-03-22 DIAGNOSIS — M5451 Vertebrogenic low back pain: Secondary | ICD-10-CM | POA: Diagnosis not present

## 2022-03-23 ENCOUNTER — Other Ambulatory Visit: Payer: Self-pay | Admitting: Specialist

## 2022-03-23 DIAGNOSIS — M5451 Vertebrogenic low back pain: Secondary | ICD-10-CM

## 2022-03-30 ENCOUNTER — Ambulatory Visit
Admission: RE | Admit: 2022-03-30 | Discharge: 2022-03-30 | Disposition: A | Discharge status: Home / Self Care | Payer: Medicare Other | Source: Ambulatory Visit | Attending: Specialist | Admitting: Specialist

## 2022-03-30 DIAGNOSIS — M5451 Vertebrogenic low back pain: Secondary | ICD-10-CM

## 2022-03-30 DIAGNOSIS — M48061 Spinal stenosis, lumbar region without neurogenic claudication: Secondary | ICD-10-CM | POA: Diagnosis not present

## 2022-03-30 DIAGNOSIS — M5126 Other intervertebral disc displacement, lumbar region: Secondary | ICD-10-CM | POA: Diagnosis not present

## 2022-03-30 DIAGNOSIS — M4316 Spondylolisthesis, lumbar region: Secondary | ICD-10-CM | POA: Diagnosis not present

## 2022-03-30 MED ORDER — IOPAMIDOL (ISOVUE-M 200) INJECTION 41%
18.0000 mL | Freq: Once | INTRAMUSCULAR | Status: AC
  Administered 2022-03-30: 18 mL via INTRATHECAL

## 2022-03-30 MED ORDER — ONDANSETRON HCL 4 MG/2ML IJ SOLN: 4.0000 mg | Freq: Once | INTRAMUSCULAR | Status: DC | PRN

## 2022-03-30 MED ORDER — DIAZEPAM 5 MG PO TABS
5.0000 mg | ORAL_TABLET | Freq: Once | ORAL | Status: AC
  Administered 2022-03-30: 5 mg via ORAL

## 2022-03-30 MED ORDER — MEPERIDINE HCL 50 MG/ML IJ SOLN: 50.0000 mg | Freq: Once | INTRAMUSCULAR | Status: DC | PRN

## 2022-04-05 DIAGNOSIS — M5451 Vertebrogenic low back pain: Secondary | ICD-10-CM | POA: Diagnosis not present

## 2022-05-10 DIAGNOSIS — M79642 Pain in left hand: Secondary | ICD-10-CM | POA: Diagnosis not present

## 2022-05-10 DIAGNOSIS — R682 Dry mouth, unspecified: Secondary | ICD-10-CM | POA: Diagnosis not present

## 2022-05-10 DIAGNOSIS — R768 Other specified abnormal immunological findings in serum: Secondary | ICD-10-CM | POA: Diagnosis not present

## 2022-05-10 DIAGNOSIS — H04129 Dry eye syndrome of unspecified lacrimal gland: Secondary | ICD-10-CM | POA: Diagnosis not present

## 2022-05-10 DIAGNOSIS — M79641 Pain in right hand: Secondary | ICD-10-CM | POA: Diagnosis not present

## 2022-05-10 DIAGNOSIS — M1990 Unspecified osteoarthritis, unspecified site: Secondary | ICD-10-CM | POA: Diagnosis not present

## 2022-05-21 DIAGNOSIS — M5451 Vertebrogenic low back pain: Secondary | ICD-10-CM | POA: Diagnosis not present

## 2022-06-28 ENCOUNTER — Ambulatory Visit: Payer: Self-pay | Admitting: Orthopedic Surgery

## 2022-06-28 DIAGNOSIS — M48062 Spinal stenosis, lumbar region with neurogenic claudication: Secondary | ICD-10-CM

## 2022-06-30 ENCOUNTER — Ambulatory Visit: Payer: Self-pay | Admitting: Orthopedic Surgery

## 2022-06-30 NOTE — H&P (Signed)
Arthur Beck is an 81 y.o. male.   Chief Complaint: back and right leg pain HPI: Reason for Visit: (normal) visit for: (recheck back); follow up after rheumatologist Location (Lower Extremity): lower back pain on the right (down right leg to foot) Severity: pain level 4/10; mild; varies in pain Timing: pain with driving and sitting for long periods of time Quality: aching; dull Aggravating Factors: sitting for 30 min Associated Symptoms: numbness/tingling; change in bladder/bowel habits Medications: not taking anything for pain advil PM prn He was seen by rheumatologist for his elevated ANA and they thought it was not related to a specific rheumatologic disorder and felt he had osteoarthritis. I reviewed his outside consultation. Still reports pain radiation down into the right leg. Temporary relief from a epidural steroid injection on the right  Past Medical History:  Diagnosis Date   Arthritis    BPH (benign prostatic hyperplasia)    Headache    Hyperlipidemia    Mild   Hypothyroid    Peripheral neuropathy    Radiculopathy    Reflux     Past Surgical History:  Procedure Laterality Date   INGUINAL HERNIA REPAIR     left   KNEE ARTHROSCOPY     left   PROSTATE SURGERY  2010   TUNA   SHOULDER SURGERY     right   TRANSURETHRAL RESECTION OF PROSTATE N/A 10/17/2017   Procedure: TRANSURETHRAL RESECTION OF THE PROSTATE (TURP);  Surgeon: Cleon Gustin, MD;  Location: [facility];  Service: Urology;  Laterality: N/A;    Family History  Problem Relation Age of Onset   Alzheimer's disease Mother    Kidney disease Father    Peripheral vascular disease Brother 80   Diabetes Brother        Died age 43   Neuropathy Neg Hx    Social History:  reports that he has quit smoking. His smoking use included cigarettes. His smokeless tobacco use includes snuff. He reports that he does not drink alcohol and does not use drugs.  Allergies: No Known Allergies  Current  meds: levothyroxine 112 mcg tablet omeprazole 40 mg capsule,delayed release Restasis 0.05 % eye drops in a dropperette  Review of Systems  Constitutional: Negative.   HENT: Negative.    Eyes: Negative.   Respiratory: Negative.    Cardiovascular: Negative.   Gastrointestinal: Negative.   Endocrine: Negative.   Genitourinary: Negative.   Musculoskeletal:  Positive for back pain and gait problem.  Neurological:  Positive for weakness and numbness.  Psychiatric/Behavioral: Negative.      There were no vitals taken for this visit. Physical Exam Constitutional:      Appearance: Normal appearance.  HENT:     Head: Normocephalic and atraumatic.     Right Ear: External ear normal.     Left Ear: External ear normal.     Nose: Nose normal.     Mouth/Throat:     Pharynx: Oropharynx is clear.  Eyes:     Conjunctiva/sclera: Conjunctivae normal.  Cardiovascular:     Rate and Rhythm: Normal rate and regular rhythm.     Pulses: Normal pulses.  Pulmonary:     Effort: Pulmonary effort is normal.  Abdominal:     General: Bowel sounds are normal.  Musculoskeletal:     Cervical back: Normal range of motion.     Comments: Gait and Station: Appearance: ambulating with no assistive devices and antalgic gait.  Constitutional: General Appearance: healthy-appearing and distress (mild).  Psychiatric: Mood  and Affect: active and alert.  Cardiovascular System: Edema Right: none; Dorsalis and posterior tibial pulses 2+. Edema Left: none.  Abdomen: Inspection and Palpation: non-distended and no tenderness.  Skin: Inspection and palpation: no rash.  Lumbar Spine: Inspection: normal alignment. Bony Palpation of the Lumbar Spine: tender at lumbosacral junction.. Bony Palpation of the Right Hip: no tenderness of the greater trochanter and tenderness of the SI joint; Pelvis stable. Bony Palpation of the Left Hip: no tenderness of the greater trochanter and tenderness of the SI joint. Soft Tissue  Palpation on the Right: No flank pain with percussion. Active Range of Motion: limited flexion and extention.  Motor Strength: L1 Motor Strength on the Right: hip flexion iliopsoas 5/5. L1 Motor Strength on the Left: hip flexion iliopsoas 5/5. L2-L4 Motor Strength on the Right: knee extension quadriceps 5/5. L2-L4 Motor Strength on the Left: knee extension quadriceps 5/5. L5 Motor Strength on the Right: ankle dorsiflexion tibialis anterior 5/5 and great toe extension extensor hallucis longus 5/5. L5 Motor Strength on the Left: ankle dorsiflexion tibialis anterior 5/5 and great toe extension extensor hallucis longus 5/5. S1 Motor Strength on the Right: plantar flexion gastrocnemius 5/5. S1 Motor Strength on the Left: plantar flexion gastrocnemius 5/5.  Neurological System: Knee Reflex Right: normal (2). Knee Reflex Left: normal (2). Ankle Reflex Right: normal (2). Ankle Reflex Left: normal (2). Babinski Reflex Right: plantar reflex absent. Babinski Reflex Left: plantar reflex absent. Sensation on the Right: normal distal extremities. Sensation on the Left: normal distal extremities. Special Tests on the Right: no clonus of the ankle/knee and seated straight leg raising test positive. Special Tests on the Left: no clonus of the ankle/knee.  Skin:    General: Skin is warm and dry.  Neurological:     Mental Status: He is alert.    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 This was independently reviewed by myself. There is severe facet arthrosis at L4-5 and a minimal grade 1 spondylolisthesis.  Lab reports reviewed in epic demonstrated an elevated ANA. Considered an insignificant by the rheumatologist.  Assessment/Plan Impression:  1. Right lower extremity radicular pain possibly secondary lateral recess stenosis at L4-5 temporary relief from an epidural steroid injection. CT myelogram indicating moderate at least lateral recess stenosis and a minimal grade 1  spondylolisthesis 2. Elevated ANA inconsequential 3. EMG nerve conduction study questionable findings of a peripheral neuropathy  Plan:  Patient is currently indicated his symptoms of escalated. He would like to discuss surgery. We discussed a lateral recess decompression at L4-5 I had an extensive discussion with the patient concerning the pathology relevant anatomy and treatment options. At this point exhausting conservative treatment and in the presence of a neurologic deficit we discussed microlumbar decompression. I discussed the risks and benefits including bleeding, infection, DVT, PE, anesthetic complications, worsening in their symptoms, improvement in their symptoms, C SF leakage, epidural fibrosis, need for future surgeries such as revision discectomy and lumbar fusion. I also indicated that this is an operation to basically decompress the nerve roots to allow recovery as opposed to fixing a herniated disc if it is encountered and that the incidence of recurrent chest disc herniation can approach 15%. Also that nerve root recovery is variable and may not recover completely. Any ligament or bone that is contributing to compressing the nerves will be removed as well.  I discussed the operative course including overnight in the hospital. Immediate ambulation. Follow-up in 2 weeks for suture removal. 6   weeks until healing of the herniation and surgical incision followed by 6 weeks of reconditioning and strengthening of the core musculature. Also discussed the need to employ the concepts of disc pressure management and core motion following the surgery to minimize the risk of recurrent disc herniation. We will obtain preoperative clearance i if necessary and proceed accordingly.   Patient is to call or present to the emergency room if there is any numbness or weakness to suggest worsening of their condition. In addition although rare if there is loss of bowel or bladder function such as  incontinence or inability to void that this may represent a cauda equina syndrome. If noted the patient is to present immediately to the emergency room for evaluation and treatment otherwise permanent bowel or bladder dysfunction can occur as a result.  Preoperative clearance. No history of DVT or MRSA  Plan lumbar laminotomy and decompression L4-5 right  Perl Folmar M Jakavion Bilodeau, PA-C for Dr Beane 06/30/2022, 8:45 AM    

## 2022-06-30 NOTE — H&P (View-Only) (Signed)
Arthur Beck is an 81 y.o. male.   Chief Complaint: back and right leg pain HPI: Reason for Visit: (normal) visit for: (recheck back); follow up after rheumatologist Location (Lower Extremity): lower back pain on the right (down right leg to foot) Severity: pain level 4/10; mild; varies in pain Timing: pain with driving and sitting for long periods of time Quality: aching; dull Aggravating Factors: sitting for 30 min Associated Symptoms: numbness/tingling; change in bladder/bowel habits Medications: not taking anything for pain advil PM prn He was seen by rheumatologist for his elevated ANA and they thought it was not related to a specific rheumatologic disorder and felt he had osteoarthritis. I reviewed his outside consultation. Still reports pain radiation down into the right leg. Temporary relief from a epidural steroid injection on the right  Past Medical History:  Diagnosis Date   Arthritis    BPH (benign prostatic hyperplasia)    Headache    Hyperlipidemia    Mild   Hypothyroid    Peripheral neuropathy    Radiculopathy    Reflux     Past Surgical History:  Procedure Laterality Date   INGUINAL HERNIA REPAIR     left   KNEE ARTHROSCOPY     left   PROSTATE SURGERY  2010   TUNA   SHOULDER SURGERY     right   TRANSURETHRAL RESECTION OF PROSTATE N/A 10/17/2017   Procedure: TRANSURETHRAL RESECTION OF THE PROSTATE (TURP);  Surgeon: Cleon Gustin, MD;  Location: [facility];  Service: Urology;  Laterality: N/A;    Family History  Problem Relation Age of Onset   Alzheimer's disease Mother    Kidney disease Father    Peripheral vascular disease Brother 80   Diabetes Brother        Died age 43   Neuropathy Neg Hx    Social History:  reports that he has quit smoking. His smoking use included cigarettes. His smokeless tobacco use includes snuff. He reports that he does not drink alcohol and does not use drugs.  Allergies: No Known Allergies  Current  meds: levothyroxine 112 mcg tablet omeprazole 40 mg capsule,delayed release Restasis 0.05 % eye drops in a dropperette  Review of Systems  Constitutional: Negative.   HENT: Negative.    Eyes: Negative.   Respiratory: Negative.    Cardiovascular: Negative.   Gastrointestinal: Negative.   Endocrine: Negative.   Genitourinary: Negative.   Musculoskeletal:  Positive for back pain and gait problem.  Neurological:  Positive for weakness and numbness.  Psychiatric/Behavioral: Negative.      There were no vitals taken for this visit. Physical Exam Constitutional:      Appearance: Normal appearance.  HENT:     Head: Normocephalic and atraumatic.     Right Ear: External ear normal.     Left Ear: External ear normal.     Nose: Nose normal.     Mouth/Throat:     Pharynx: Oropharynx is clear.  Eyes:     Conjunctiva/sclera: Conjunctivae normal.  Cardiovascular:     Rate and Rhythm: Normal rate and regular rhythm.     Pulses: Normal pulses.  Pulmonary:     Effort: Pulmonary effort is normal.  Abdominal:     General: Bowel sounds are normal.  Musculoskeletal:     Cervical back: Normal range of motion.     Comments: Gait and Station: Appearance: ambulating with no assistive devices and antalgic gait.  Constitutional: General Appearance: healthy-appearing and distress (mild).  Psychiatric: Mood  and Affect: active and alert.  Cardiovascular System: Edema Right: none; Dorsalis and posterior tibial pulses 2+. Edema Left: none.  Abdomen: Inspection and Palpation: non-distended and no tenderness.  Skin: Inspection and palpation: no rash.  Lumbar Spine: Inspection: normal alignment. Bony Palpation of the Lumbar Spine: tender at lumbosacral junction.. Bony Palpation of the Right Hip: no tenderness of the greater trochanter and tenderness of the SI joint; Pelvis stable. Bony Palpation of the Left Hip: no tenderness of the greater trochanter and tenderness of the SI joint. Soft Tissue  Palpation on the Right: No flank pain with percussion. Active Range of Motion: limited flexion and extention.  Motor Strength: L1 Motor Strength on the Right: hip flexion iliopsoas 5/5. L1 Motor Strength on the Left: hip flexion iliopsoas 5/5. L2-L4 Motor Strength on the Right: knee extension quadriceps 5/5. L2-L4 Motor Strength on the Left: knee extension quadriceps 5/5. L5 Motor Strength on the Right: ankle dorsiflexion tibialis anterior 5/5 and great toe extension extensor hallucis longus 5/5. L5 Motor Strength on the Left: ankle dorsiflexion tibialis anterior 5/5 and great toe extension extensor hallucis longus 5/5. S1 Motor Strength on the Right: plantar flexion gastrocnemius 5/5. S1 Motor Strength on the Left: plantar flexion gastrocnemius 5/5.  Neurological System: Knee Reflex Right: normal (2). Knee Reflex Left: normal (2). Ankle Reflex Right: normal (2). Ankle Reflex Left: normal (2). Babinski Reflex Right: plantar reflex absent. Babinski Reflex Left: plantar reflex absent. Sensation on the Right: normal distal extremities. Sensation on the Left: normal distal extremities. Special Tests on the Right: no clonus of the ankle/knee and seated straight leg raising test positive. Special Tests on the Left: no clonus of the ankle/knee.  Skin:    General: Skin is warm and dry.  Neurological:     Mental Status: He is alert.    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 This was independently reviewed by myself. There is severe facet arthrosis at L4-5 and a minimal grade 1 spondylolisthesis.  Lab reports reviewed in epic demonstrated an elevated ANA. Considered an insignificant by the rheumatologist.  Assessment/Plan Impression:  1. Right lower extremity radicular pain possibly secondary lateral recess stenosis at L4-5 temporary relief from an epidural steroid injection. CT myelogram indicating moderate at least lateral recess stenosis and a minimal grade 1  spondylolisthesis 2. Elevated ANA inconsequential 3. EMG nerve conduction study questionable findings of a peripheral neuropathy  Plan:  Patient is currently indicated his symptoms of escalated. He would like to discuss surgery. We discussed a lateral recess decompression at L4-5 I had an extensive discussion with the patient concerning the pathology relevant anatomy and treatment options. At this point exhausting conservative treatment and in the presence of a neurologic deficit we discussed microlumbar decompression. I discussed the risks and benefits including bleeding, infection, DVT, PE, anesthetic complications, worsening in their symptoms, improvement in their symptoms, C SF leakage, epidural fibrosis, need for future surgeries such as revision discectomy and lumbar fusion. I also indicated that this is an operation to basically decompress the nerve roots to allow recovery as opposed to fixing a herniated disc if it is encountered and that the incidence of recurrent chest disc herniation can approach 15%. Also that nerve root recovery is variable and may not recover completely. Any ligament or bone that is contributing to compressing the nerves will be removed as well.  I discussed the operative course including overnight in the hospital. Immediate ambulation. Follow-up in 2 weeks for suture removal. 6  weeks until healing of the herniation and surgical incision followed by 6 weeks of reconditioning and strengthening of the core musculature. Also discussed the need to employ the concepts of disc pressure management and core motion following the surgery to minimize the risk of recurrent disc herniation. We will obtain preoperative clearance i if necessary and proceed accordingly.   Patient is to call or present to the emergency room if there is any numbness or weakness to suggest worsening of their condition. In addition although rare if there is loss of bowel or bladder function such as  incontinence or inability to void that this may represent a cauda equina syndrome. If noted the patient is to present immediately to the emergency room for evaluation and treatment otherwise permanent bowel or bladder dysfunction can occur as a result.  Preoperative clearance. No history of DVT or MRSA  Plan lumbar laminotomy and decompression L4-5 right  Dorothy Spark, PA-C for Dr Shelle Iron 06/30/2022, 8:45 AM

## 2022-07-14 NOTE — Progress Notes (Signed)
Surgical Instructions    Your procedure is scheduled on Friday, 07/23/22.  Report to [facility] Main Entrance "A" at 10:30 A.M., then check in with the Admitting office.  Call this number if you have problems the morning of surgery:  [phone]   If you have any questions prior to your surgery date call [phone]: Open Monday-Friday 8am-4pm If you experience any cold or flu symptoms such as cough, fever, chills, shortness of breath, etc. between now and your scheduled surgery, please notify us at the above number     Remember:  Do not eat after midnight the night before your surgery  You may drink clear liquids until 9:30am the morning of your surgery.   Clear liquids allowed are: Water, Non-Citrus Juices (without pulp), Carbonated Beverages, Clear Tea, Black Coffee ONLY (NO MILK, CREAM OR POWDERED CREAMER of any kind), and Gatorade  Patient Instructions  The night before surgery:  No food after midnight. ONLY clear liquids after midnight  The day of surgery (if you do NOT have diabetes):  Drink ONE (1) Pre-Surgery Clear Ensure by 9:30am the morning of surgery. Drink in one sitting. Do not sip.  This drink was given to you during your hospital  pre-op appointment visit. Nothing else to drink after completing the  Pre-Surgery Clear Ensure.           If you have questions, please contact your surgeon's office.     Take these medicines the morning of surgery with A SIP OF WATER:  levothyroxine (SYNTHROID, LEVOTHROID) omeprazole (PRILOSEC)  As of today, STOP taking any Aspirin (unless otherwise instructed by your surgeon) Aleve, Naproxen, Ibuprofen, Motrin, Advil, Goody's, BC's, all herbal medications, fish oil, and all vitamins.           Do not wear jewelry or makeup. Do not wear lotions, powders, perfumes/cologne or deodorant. Men may shave face and neck. Do not bring valuables to the hospital. Do not wear nail polish, gel polish, artificial nails, or any other type  of covering on natural nails (fingers and toes) If you have artificial nails or gel coating that need to be removed by a nail salon, please have this removed prior to surgery. Artificial nails or gel coating may interfere with anesthesia's ability to adequately monitor your vital signs.  [facility] is not responsible for any belongings or valuables.    Do NOT Smoke (Tobacco/Vaping)  24 hours prior to your procedure  If you use a CPAP at night, you may bring your mask for your overnight stay.   Contacts, glasses, hearing aids, dentures or partials may not be worn into surgery, please bring cases for these belongings   For patients admitted to the hospital, discharge time will be determined by your treatment team.   Patients discharged the day of surgery will not be allowed to drive home, and someone needs to stay with them for 24 hours.   SURGICAL WAITING ROOM VISITATION Patients having surgery or a procedure may have no more than 2 support people in the waiting area - these visitors may rotate.   Children under the age of 52 must have an adult with them who is not the patient. If the patient needs to stay at the hospital during part of their recovery, the visitor guidelines for inpatient rooms apply. Pre-op nurse will coordinate an appropriate time for 1 support person to accompany patient in pre-op.  This support person may not rotate.   Please refer to the [facility] website for the visitor  guidelines for Inpatients (after your surgery is over and you are in a regular room).    Special instructions:    Oral Hygiene is also important to reduce your risk of infection.  Remember - BRUSH YOUR TEETH THE MORNING OF SURGERY WITH YOUR REGULAR TOOTHPASTE   [place] [place]- Preparing For Surgery  Before surgery, you can play an important role. Because skin is not sterile, your skin needs to be as free of germs as possible. You can reduce the number of germs on your skin by washing with CHG  (chlorahexidine gluconate) Soap before surgery.  CHG is an antiseptic cleaner which kills germs and bonds with the skin to continue killing germs even after washing.     Please do not use if you have an allergy to CHG or antibacterial soaps. If your skin becomes reddened/irritated stop using the CHG.  Do not shave (including legs and underarms) for at least 48 hours prior to first CHG shower. It is OK to shave your face.  Please follow these instructions carefully.     Shower the NIGHT BEFORE SURGERY and the MORNING OF SURGERY with CHG Soap.   If you chose to wash your hair, wash your hair first as usual with your normal shampoo. After you shampoo, rinse your hair and body thoroughly to remove the shampoo.  Then [employer] and genitals (private parts) with your normal soap and rinse thoroughly to remove soap.  After that Use CHG Soap as you would any other liquid soap. You can apply CHG directly to the skin and wash gently with a scrungie or a clean washcloth.   Apply the CHG Soap to your body ONLY FROM THE NECK DOWN.  Do not use on open wounds or open sores. Avoid contact with your eyes, ears, mouth and genitals (private parts). Wash Face and genitals (private parts)  with your normal soap.   Wash thoroughly, paying special attention to the area where your surgery will be performed.  Thoroughly rinse your body with warm water from the neck down.  DO NOT shower/wash with your normal soap after using and rinsing off the CHG Soap.  Pat yourself dry with a CLEAN TOWEL.  Wear CLEAN PAJAMAS to bed the night before surgery  Place CLEAN SHEETS on your bed the night before your surgery  DO NOT SLEEP WITH PETS.   Day of Surgery: Take a shower with CHG soap. Wear Clean/Comfortable clothing the morning of surgery Do not apply any deodorants/lotions.   Remember to brush your teeth WITH YOUR REGULAR TOOTHPASTE.    If you received a COVID test during your pre-op visit, it is requested that  you wear a mask when out in public, stay away from anyone that may not be feeling well, and notify your surgeon if you develop symptoms. If you have been in contact with anyone that has tested positive in the last 10 days, please notify your surgeon.    Please read over the following fact sheets that you were given.

## 2022-07-15 ENCOUNTER — Encounter (HOSPITAL_COMMUNITY)
Admission: RE | Admit: 2022-07-15 | Discharge: 2022-07-15 | Disposition: A | Discharge status: Home / Self Care | Payer: Medicare Other | Source: Ambulatory Visit | Attending: Specialist | Admitting: Specialist

## 2022-07-15 ENCOUNTER — Other Ambulatory Visit: Payer: Self-pay

## 2022-07-15 ENCOUNTER — Encounter (HOSPITAL_COMMUNITY): Payer: Self-pay

## 2022-07-15 ENCOUNTER — Ambulatory Visit (HOSPITAL_COMMUNITY)
Admission: RE | Admit: 2022-07-15 | Discharge: 2022-07-15 | Disposition: A | Discharge status: Home / Self Care | Payer: Medicare Other | Source: Ambulatory Visit | Attending: Orthopedic Surgery | Admitting: Orthopedic Surgery

## 2022-07-15 VITALS — BP 164/77 | HR 69 | Temp 98.6°F | Resp 17 | Ht 67.0 in | Wt 188.3 lb

## 2022-07-15 DIAGNOSIS — M48062 Spinal stenosis, lumbar region with neurogenic claudication: Secondary | ICD-10-CM | POA: Insufficient documentation

## 2022-07-15 DIAGNOSIS — Z789 Other specified health status: Secondary | ICD-10-CM | POA: Insufficient documentation

## 2022-07-15 DIAGNOSIS — Z01818 Encounter for other preprocedural examination: Secondary | ICD-10-CM | POA: Insufficient documentation

## 2022-07-15 DIAGNOSIS — M4316 Spondylolisthesis, lumbar region: Secondary | ICD-10-CM | POA: Diagnosis not present

## 2022-07-15 LAB — CBC
HCT: 43 % (ref 39.0–52.0)
Hemoglobin: 14 g/dL (ref 13.0–17.0)
MCH: 31.1 pg (ref 26.0–34.0)
MCHC: 32.6 g/dL (ref 30.0–36.0)
MCV: 95.6 fL (ref 80.0–100.0)
Platelets: 273 10*3/uL (ref 150–400)
RBC: 4.5 MIL/uL (ref 4.22–5.81)
RDW: 13.5 % (ref 11.5–15.5)
WBC: 7.3 10*3/uL (ref 4.0–10.5)
nRBC: 0 % (ref 0.0–0.2)

## 2022-07-15 LAB — COMPREHENSIVE METABOLIC PANEL
ALT: 20 U/L (ref 0–44)
AST: 24 U/L (ref 15–41)
Albumin: 4.2 g/dL (ref 3.5–5.0)
Alkaline Phosphatase: 44 U/L (ref 38–126)
Anion gap: 4 — ABNORMAL LOW (ref 5–15)
BUN: 18 mg/dL (ref 8–23)
CO2: 25 mmol/L (ref 22–32)
Calcium: 9.3 mg/dL (ref 8.9–10.3)
Chloride: 109 mmol/L (ref 98–111)
Creatinine, Ser: 1.13 mg/dL (ref 0.61–1.24)
GFR, Estimated: >60 mL/min (ref >60)
Glucose, Bld: 98 mg/dL (ref 70–99)
Potassium: 4.5 mmol/L (ref 3.5–5.1)
Sodium: 138 mmol/L (ref 135–145)
Total Bilirubin: 1.2 mg/dL (ref 0.3–1.2)
Total Protein: 7.5 g/dL (ref 6.5–8.1)

## 2022-07-15 LAB — SURGICAL PCR SCREEN
MRSA, PCR: NEGATIVE
Staphylococcus aureus: NEGATIVE

## 2022-07-15 NOTE — Progress Notes (Signed)
PCP - Dr.Mitchell Firefighter - n/a (saw Dr.Hochrein in 2015 for chest pain. Stress work up negative-no follow up)  PPM/ICD - denies Device Orders - n/a Rep Notified - n/a  Chest x-ray - n/a EKG - 2019 Stress Test - 07/11/2014 ECHO - denies Cardiac Cath - denies  Sleep Study - denies CPAP - n/a  Patient denies Diabetes.  Blood Thinner Instructions:n/a Aspirin Instructions:n/a  ERAS Protcol -yes PRE-SURGERY Ensure -given at PAT  COVID TEST- n/a   Anesthesia review: NO  Patient denies shortness of breath, fever, cough and chest pain at PAT appointment   All instructions explained to the patient, with a verbal understanding of the material. Patient agrees to go over the instructions while at home for a better understanding. Patient also instructed to self quarantine after being tested for COVID-19. The opportunity to ask questions was provided.

## 2022-07-22 DIAGNOSIS — H35371 Puckering of macula, right eye: Secondary | ICD-10-CM | POA: Diagnosis not present

## 2022-07-22 DIAGNOSIS — H353131 Nonexudative age-related macular degeneration, bilateral, early dry stage: Secondary | ICD-10-CM | POA: Diagnosis not present

## 2022-07-22 DIAGNOSIS — Z961 Presence of intraocular lens: Secondary | ICD-10-CM | POA: Diagnosis not present

## 2022-07-22 DIAGNOSIS — H3561 Retinal hemorrhage, right eye: Secondary | ICD-10-CM | POA: Diagnosis not present

## 2022-07-22 DIAGNOSIS — H04123 Dry eye syndrome of bilateral lacrimal glands: Secondary | ICD-10-CM | POA: Diagnosis not present

## 2022-07-23 ENCOUNTER — Ambulatory Visit (HOSPITAL_COMMUNITY)
Admission: RE | Admit: 2022-07-23 | Discharge: 2022-07-23 | Disposition: A | Discharge status: Home / Self Care | Payer: Medicare Other | Attending: Specialist | Admitting: Specialist

## 2022-07-23 ENCOUNTER — Encounter (HOSPITAL_COMMUNITY): Payer: Self-pay | Admitting: Specialist

## 2022-07-23 ENCOUNTER — Encounter (HOSPITAL_COMMUNITY)
Admission: RE | Disposition: A | Discharge status: Home / Self Care | Payer: Self-pay | Source: Home / Self Care | Attending: Specialist

## 2022-07-23 ENCOUNTER — Ambulatory Visit (HOSPITAL_COMMUNITY): Payer: Medicare Other

## 2022-07-23 ENCOUNTER — Ambulatory Visit (HOSPITAL_BASED_OUTPATIENT_CLINIC_OR_DEPARTMENT_OTHER): Payer: Medicare Other | Admitting: Anesthesiology

## 2022-07-23 ENCOUNTER — Other Ambulatory Visit: Payer: Self-pay

## 2022-07-23 ENCOUNTER — Ambulatory Visit (HOSPITAL_COMMUNITY): Payer: Medicare Other | Admitting: Anesthesiology

## 2022-07-23 DIAGNOSIS — M47816 Spondylosis without myelopathy or radiculopathy, lumbar region: Secondary | ICD-10-CM | POA: Diagnosis not present

## 2022-07-23 DIAGNOSIS — Z87891 Personal history of nicotine dependence: Secondary | ICD-10-CM | POA: Insufficient documentation

## 2022-07-23 DIAGNOSIS — E039 Hypothyroidism, unspecified: Secondary | ICD-10-CM

## 2022-07-23 DIAGNOSIS — E785 Hyperlipidemia, unspecified: Secondary | ICD-10-CM | POA: Diagnosis not present

## 2022-07-23 DIAGNOSIS — Z981 Arthrodesis status: Secondary | ICD-10-CM | POA: Diagnosis not present

## 2022-07-23 DIAGNOSIS — M48061 Spinal stenosis, lumbar region without neurogenic claudication: Secondary | ICD-10-CM | POA: Diagnosis present

## 2022-07-23 DIAGNOSIS — M1990 Unspecified osteoarthritis, unspecified site: Secondary | ICD-10-CM | POA: Diagnosis not present

## 2022-07-23 DIAGNOSIS — K219 Gastro-esophageal reflux disease without esophagitis: Secondary | ICD-10-CM | POA: Insufficient documentation

## 2022-07-23 DIAGNOSIS — M48062 Spinal stenosis, lumbar region with neurogenic claudication: Secondary | ICD-10-CM | POA: Diagnosis not present

## 2022-07-23 DIAGNOSIS — G709 Myoneural disorder, unspecified: Secondary | ICD-10-CM | POA: Insufficient documentation

## 2022-07-23 DIAGNOSIS — M5116 Intervertebral disc disorders with radiculopathy, lumbar region: Secondary | ICD-10-CM | POA: Diagnosis not present

## 2022-07-23 DIAGNOSIS — M5126 Other intervertebral disc displacement, lumbar region: Secondary | ICD-10-CM | POA: Diagnosis not present

## 2022-07-23 HISTORY — PX: LUMBAR LAMINECTOMY/DECOMPRESSION MICRODISCECTOMY: SHX5026

## 2022-07-23 SURGERY — LUMBAR LAMINECTOMY/DECOMPRESSION MICRODISCECTOMY 1 LEVEL
Anesthesia: General | Laterality: Right

## 2022-07-23 MED ORDER — CHLORHEXIDINE GLUCONATE 0.12 % MT SOLN
15.0000 mL | Freq: Once | OROMUCOSAL | Status: AC
  Administered 2022-07-23: 15 mL via OROMUCOSAL
  Filled 2022-07-23: qty 15

## 2022-07-23 MED ORDER — ACETAMINOPHEN 325 MG PO TABS
650.0000 mg | ORAL_TABLET | ORAL | Status: DC | PRN
  Administered 2022-07-23: 650 mg via ORAL
  Filled 2022-07-23: qty 2

## 2022-07-23 MED ORDER — PROPOFOL 10 MG/ML IV BOLUS
INTRAVENOUS | Status: DC | PRN
  Administered 2022-07-23: 150 mg via INTRAVENOUS

## 2022-07-23 MED ORDER — ORAL CARE MOUTH RINSE: 15.0000 mL | Freq: Once | OROMUCOSAL | Status: AC

## 2022-07-23 MED ORDER — ROCURONIUM BROMIDE 10 MG/ML (PF) SYRINGE
PREFILLED_SYRINGE | INTRAVENOUS | Status: DC | PRN
  Administered 2022-07-23: 50 mg via INTRAVENOUS

## 2022-07-23 MED ORDER — LIDOCAINE 2% (20 MG/ML) 5 ML SYRINGE
INTRAMUSCULAR | Status: AC
  Filled 2022-07-23: qty 5

## 2022-07-23 MED ORDER — ONDANSETRON HCL 4 MG PO TABS: 4.0000 mg | ORAL_TABLET | Freq: Four times a day (QID) | ORAL | Status: DC | PRN

## 2022-07-23 MED ORDER — ONDANSETRON HCL 4 MG/2ML IJ SOLN
INTRAMUSCULAR | Status: AC
  Filled 2022-07-23: qty 2

## 2022-07-23 MED ORDER — BUPIVACAINE-EPINEPHRINE 0.5% -1:200000 IJ SOLN
INTRAMUSCULAR | Status: DC | PRN
  Administered 2022-07-23: 1 mL

## 2022-07-23 MED ORDER — EPHEDRINE SULFATE-NACL 50-0.9 MG/10ML-% IV SOSY
PREFILLED_SYRINGE | INTRAVENOUS | Status: DC | PRN
  Administered 2022-07-23: 5 mg via INTRAVENOUS

## 2022-07-23 MED ORDER — SUGAMMADEX SODIUM 200 MG/2ML IV SOLN
INTRAVENOUS | Status: DC | PRN
  Administered 2022-07-23: 200 mg via INTRAVENOUS

## 2022-07-23 MED ORDER — HYDROMORPHONE HCL 1 MG/ML IJ SOLN
INTRAMUSCULAR | Status: AC
  Filled 2022-07-23: qty 1

## 2022-07-23 MED ORDER — 0.9 % SODIUM CHLORIDE (POUR BTL) OPTIME
TOPICAL | Status: DC | PRN
  Administered 2022-07-23: 1000 mL

## 2022-07-23 MED ORDER — OXYCODONE HCL 5 MG PO TABS: 5.0000 mg | ORAL_TABLET | ORAL | Status: DC | PRN

## 2022-07-23 MED ORDER — LIDOCAINE 2% (20 MG/ML) 5 ML SYRINGE
INTRAMUSCULAR | Status: DC | PRN
  Administered 2022-07-23: 100 mg via INTRAVENOUS

## 2022-07-23 MED ORDER — FENTANYL CITRATE (PF) 250 MCG/5ML IJ SOLN
INTRAMUSCULAR | Status: AC
  Filled 2022-07-23: qty 5

## 2022-07-23 MED ORDER — LEVOTHYROXINE SODIUM 112 MCG PO TABS
112.0000 ug | ORAL_TABLET | Freq: Every day | ORAL | Status: DC
  Filled 2022-07-23: qty 1

## 2022-07-23 MED ORDER — PHENOL 1.4 % MT LIQD: 1.0000 | OROMUCOSAL | Status: DC | PRN

## 2022-07-23 MED ORDER — ARTIFICIAL TEARS OPHTHALMIC OINT
1.0000 | TOPICAL_OINTMENT | Freq: Every day | OPHTHALMIC | Status: DC
  Filled 2022-07-23: qty 3.5

## 2022-07-23 MED ORDER — MENTHOL 3 MG MT LOZG: 1.0000 | LOZENGE | OROMUCOSAL | Status: DC | PRN

## 2022-07-23 MED ORDER — ALUM & MAG HYDROXIDE-SIMETH 200-200-20 MG/5ML PO SUSP: 30.0000 mL | Freq: Four times a day (QID) | ORAL | Status: DC | PRN

## 2022-07-23 MED ORDER — POLYETHYLENE GLYCOL 3350 17 G PO PACK: 17.0000 g | PACK | Freq: Every day | ORAL | Status: DC | PRN

## 2022-07-23 MED ORDER — KCL IN DEXTROSE-NACL 20-5-0.45 MEQ/L-%-% IV SOLN: INTRAVENOUS | Status: DC

## 2022-07-23 MED ORDER — THROMBIN 20000 UNITS EX SOLR
CUTANEOUS | Status: DC | PRN
  Administered 2022-07-23: 20 mL via TOPICAL

## 2022-07-23 MED ORDER — METHOCARBAMOL 500 MG PO TABS
500.0000 mg | ORAL_TABLET | Freq: Four times a day (QID) | ORAL | Status: DC | PRN
  Administered 2022-07-23: 500 mg via ORAL
  Filled 2022-07-23: qty 1

## 2022-07-23 MED ORDER — PHENYLEPHRINE HCL-NACL 20-0.9 MG/250ML-% IV SOLN
INTRAVENOUS | Status: DC | PRN
  Administered 2022-07-23: 25 ug/min via INTRAVENOUS

## 2022-07-23 MED ORDER — FENTANYL CITRATE (PF) 250 MCG/5ML IJ SOLN
INTRAMUSCULAR | Status: DC | PRN
  Administered 2022-07-23 (×2): 50 ug via INTRAVENOUS
  Administered 2022-07-23 (×2): 25 ug via INTRAVENOUS
  Administered 2022-07-23: 100 ug via INTRAVENOUS

## 2022-07-23 MED ORDER — VITAMIN D 25 MCG (1000 UNIT) PO TABS: 2000.0000 [IU] | ORAL_TABLET | Freq: Every day | ORAL | Status: DC

## 2022-07-23 MED ORDER — OXYCODONE HCL 5 MG PO TABS: 10.0000 mg | ORAL_TABLET | ORAL | Status: DC | PRN

## 2022-07-23 MED ORDER — ONDANSETRON HCL 4 MG/2ML IJ SOLN
INTRAMUSCULAR | Status: DC | PRN
  Administered 2022-07-23: 4 mg via INTRAVENOUS

## 2022-07-23 MED ORDER — BISACODYL 5 MG PO TBEC: 5.0000 mg | DELAYED_RELEASE_TABLET | Freq: Every day | ORAL | Status: DC | PRN

## 2022-07-23 MED ORDER — ACETAMINOPHEN 10 MG/ML IV SOLN
1000.0000 mg | INTRAVENOUS | Status: AC
  Administered 2022-07-23: 1000 mg via INTRAVENOUS
  Filled 2022-07-23: qty 100

## 2022-07-23 MED ORDER — DEXAMETHASONE SODIUM PHOSPHATE 10 MG/ML IJ SOLN
INTRAMUSCULAR | Status: AC
  Filled 2022-07-23: qty 1

## 2022-07-23 MED ORDER — TRAMADOL HCL 50 MG PO TABS: 50.0000 mg | ORAL_TABLET | Freq: Four times a day (QID) | ORAL | 1 refills | Status: AC | PRN

## 2022-07-23 MED ORDER — TRAMADOL HCL 50 MG PO TABS
50.0000 mg | ORAL_TABLET | Freq: Once | ORAL | Status: AC
  Administered 2022-07-23: 50 mg via ORAL
  Filled 2022-07-23: qty 1

## 2022-07-23 MED ORDER — LACTATED RINGERS IV SOLN: INTRAVENOUS | Status: DC

## 2022-07-23 MED ORDER — OXYCODONE HCL 5 MG PO TABS: 5.0000 mg | ORAL_TABLET | ORAL | 0 refills | Status: DC | PRN

## 2022-07-23 MED ORDER — TIZANIDINE HCL 4 MG PO TABS
4.0000 mg | ORAL_TABLET | Freq: Once | ORAL | Status: AC
  Administered 2022-07-23: 4 mg via ORAL
  Filled 2022-07-23: qty 1

## 2022-07-23 MED ORDER — RISAQUAD PO CAPS
1.0000 | ORAL_CAPSULE | Freq: Every day | ORAL | Status: DC
  Administered 2022-07-23: 1 via ORAL
  Filled 2022-07-23: qty 1

## 2022-07-23 MED ORDER — DOCUSATE SODIUM 100 MG PO CAPS: 100.0000 mg | ORAL_CAPSULE | Freq: Two times a day (BID) | ORAL | 1 refills | Status: AC | PRN

## 2022-07-23 MED ORDER — BUPIVACAINE-EPINEPHRINE (PF) 0.5% -1:200000 IJ SOLN
INTRAMUSCULAR | Status: AC
  Filled 2022-07-23: qty 30

## 2022-07-23 MED ORDER — ONDANSETRON HCL 4 MG/2ML IJ SOLN: 4.0000 mg | Freq: Four times a day (QID) | INTRAMUSCULAR | Status: DC | PRN

## 2022-07-23 MED ORDER — HYDROMORPHONE HCL 1 MG/ML IJ SOLN
0.2500 mg | INTRAMUSCULAR | Status: DC | PRN
  Administered 2022-07-23 (×2): 0.25 mg via INTRAVENOUS

## 2022-07-23 MED ORDER — CEFAZOLIN SODIUM-DEXTROSE 2-4 GM/100ML-% IV SOLN: 2.0000 g | Freq: Three times a day (TID) | INTRAVENOUS | Status: DC

## 2022-07-23 MED ORDER — POLYETHYLENE GLYCOL 3350 17 G PO PACK: 17.0000 g | PACK | Freq: Every day | ORAL | 0 refills | Status: AC

## 2022-07-23 MED ORDER — MAGNESIUM CITRATE PO SOLN: 1.0000 | Freq: Once | ORAL | Status: DC | PRN

## 2022-07-23 MED ORDER — TIZANIDINE HCL 4 MG PO CAPS: 4.0000 mg | ORAL_CAPSULE | Freq: Three times a day (TID) | ORAL | 1 refills | Status: AC | PRN

## 2022-07-23 MED ORDER — ROCURONIUM BROMIDE 10 MG/ML (PF) SYRINGE
PREFILLED_SYRINGE | INTRAVENOUS | Status: AC
  Filled 2022-07-23: qty 10

## 2022-07-23 MED ORDER — DEXAMETHASONE SODIUM PHOSPHATE 10 MG/ML IJ SOLN
INTRAMUSCULAR | Status: DC | PRN
  Administered 2022-07-23: 10 mg via INTRAVENOUS

## 2022-07-23 MED ORDER — DOCUSATE SODIUM 100 MG PO CAPS: 100.0000 mg | ORAL_CAPSULE | Freq: Two times a day (BID) | ORAL | Status: DC

## 2022-07-23 MED ORDER — HYDROMORPHONE HCL 1 MG/ML IJ SOLN: 0.5000 mg | INTRAMUSCULAR | Status: DC | PRN

## 2022-07-23 MED ORDER — CEFAZOLIN SODIUM-DEXTROSE 2-4 GM/100ML-% IV SOLN
INTRAVENOUS | Status: AC
  Filled 2022-07-23: qty 100

## 2022-07-23 MED ORDER — THROMBIN 20000 UNITS EX SOLR
CUTANEOUS | Status: AC
  Filled 2022-07-23: qty 20000

## 2022-07-23 MED ORDER — PHENYLEPHRINE 80 MCG/ML (10ML) SYRINGE FOR IV PUSH (FOR BLOOD PRESSURE SUPPORT)
PREFILLED_SYRINGE | INTRAVENOUS | Status: DC | PRN
  Administered 2022-07-23 (×2): 160 ug via INTRAVENOUS

## 2022-07-23 MED ORDER — CEFAZOLIN SODIUM-DEXTROSE 2-4 GM/100ML-% IV SOLN
2.0000 g | INTRAVENOUS | Status: AC
  Administered 2022-07-23: 2 g via INTRAVENOUS

## 2022-07-23 MED ORDER — METHOCARBAMOL 1000 MG/10ML IJ SOLN: 500.0000 mg | Freq: Four times a day (QID) | INTRAVENOUS | Status: DC | PRN

## 2022-07-23 MED ORDER — PANTOPRAZOLE SODIUM 40 MG PO TBEC
40.0000 mg | DELAYED_RELEASE_TABLET | Freq: Every day | ORAL | Status: DC
  Administered 2022-07-23: 40 mg via ORAL
  Filled 2022-07-23: qty 1

## 2022-07-23 MED ORDER — ACETAMINOPHEN 650 MG RE SUPP: 650.0000 mg | RECTAL | Status: DC | PRN

## 2022-07-23 MED ORDER — PROPOFOL 10 MG/ML IV BOLUS
INTRAVENOUS | Status: AC
  Filled 2022-07-23: qty 20

## 2022-07-23 SURGICAL SUPPLY — 58 items
BAG COUNTER SPONGE SURGICOUNT (BAG) ×1 IMPLANT
BAG DECANTER FOR FLEXI CONT (MISCELLANEOUS) IMPLANT
BAG SPNG CNTER NS LX DISP (BAG) ×1
BAND INSRT 18 STRL LF DISP RB (MISCELLANEOUS) ×2
BAND RUBBER #18 3X1/16 STRL (MISCELLANEOUS) ×2 IMPLANT
BUR EGG ELITE 5.0 (BURR) IMPLANT
BUR RND DIAMOND ELITE 4.0 (BURR) IMPLANT
CLEANER TIP ELECTROSURG 2X2 (MISCELLANEOUS) ×1 IMPLANT
CNTNR URN SCR LID CUP LEK RST (MISCELLANEOUS) ×1 IMPLANT
CONT SPEC 4OZ STRL OR WHT (MISCELLANEOUS) ×1
DRAPE LAPAROTOMY 100X72X124 (DRAPES) ×1 IMPLANT
DRAPE MICROSCOPE SLANT 54X150 (MISCELLANEOUS) ×1 IMPLANT
DRAPE SHEET LG 3/4 BI-LAMINATE (DRAPES) ×1 IMPLANT
DRAPE SURG 17X11 SM STRL (DRAPES) ×1 IMPLANT
DRAPE UTILITY XL STRL (DRAPES) ×1 IMPLANT
DRSG AQUACEL AG ADV 3.5X 4 (GAUZE/BANDAGES/DRESSINGS) IMPLANT
DRSG AQUACEL AG ADV 3.5X 6 (GAUZE/BANDAGES/DRESSINGS) IMPLANT
DRSG TELFA 3X8 NADH STRL (GAUZE/BANDAGES/DRESSINGS) IMPLANT
DURAPREP 26ML APPLICATOR (WOUND CARE) ×1 IMPLANT
DURASEAL SPINE SEALANT 3ML (MISCELLANEOUS) IMPLANT
ELECT BLADE 4.0 EZ CLEAN MEGAD (MISCELLANEOUS)
ELECT REM PT RETURN 9FT ADLT (ELECTROSURGICAL) ×1
ELECTRODE BLDE 4.0 EZ CLN MEGD (MISCELLANEOUS) IMPLANT
ELECTRODE REM PT RTRN 9FT ADLT (ELECTROSURGICAL) ×1 IMPLANT
GLOVE BIOGEL PI IND STRL 7.5 (GLOVE) ×1 IMPLANT
GLOVE SURG SS PI 7.0 STRL IVOR (GLOVE) ×1 IMPLANT
GLOVE SURG SS PI 8.0 STRL IVOR (GLOVE) ×2 IMPLANT
GOWN STRL REUS W/ TWL LRG LVL3 (GOWN DISPOSABLE) ×1 IMPLANT
GOWN STRL REUS W/ TWL XL LVL3 (GOWN DISPOSABLE) ×1 IMPLANT
GOWN STRL REUS W/TWL LRG LVL3 (GOWN DISPOSABLE) ×1
GOWN STRL REUS W/TWL XL LVL3 (GOWN DISPOSABLE) ×4
IV CATH 14GX2 1/4 (CATHETERS) ×1 IMPLANT
KIT BASIN OR (CUSTOM PROCEDURE TRAY) ×1 IMPLANT
NDL 22X1.5 STRL (OR ONLY) (MISCELLANEOUS) ×1 IMPLANT
NDL SPNL 18GX3.5 QUINCKE PK (NEEDLE) ×2 IMPLANT
NEEDLE 22X1.5 STRL (OR ONLY) (MISCELLANEOUS) ×1 IMPLANT
NEEDLE SPNL 18GX3.5 QUINCKE PK (NEEDLE) ×2 IMPLANT
PACK LAMINECTOMY NEURO (CUSTOM PROCEDURE TRAY) ×1 IMPLANT
PATTIES SURGICAL .75X.75 (GAUZE/BANDAGES/DRESSINGS) ×1 IMPLANT
SOLUTION PRONTOSAN WOUND 350ML (IRRIGATION / IRRIGATOR) IMPLANT
SPONGE SURGIFOAM ABS GEL 100 (HEMOSTASIS) ×1 IMPLANT
SPONGE T-LAP 4X18 ~~LOC~~+RFID (SPONGE) IMPLANT
STAPLER VISISTAT (STAPLE) IMPLANT
STRIP CLOSURE SKIN 1/2X4 (GAUZE/BANDAGES/DRESSINGS) ×1 IMPLANT
SUT NURALON 4 0 TR CR/8 (SUTURE) IMPLANT
SUT PROLENE 3 0 PS 2 (SUTURE) IMPLANT
SUT VIC AB 1 CT1 27 (SUTURE)
SUT VIC AB 1 CT1 27XBRD ANTBC (SUTURE) IMPLANT
SUT VIC AB 1-0 CT2 27 (SUTURE) IMPLANT
SUT VIC AB 2-0 CT1 27 (SUTURE)
SUT VIC AB 2-0 CT1 TAPERPNT 27 (SUTURE) IMPLANT
SUT VIC AB 2-0 CT2 27 (SUTURE) IMPLANT
SYR 3ML LL SCALE MARK (SYRINGE) ×1 IMPLANT
TOWEL GREEN STERILE (TOWEL DISPOSABLE) ×1 IMPLANT
TOWEL GREEN STERILE FF (TOWEL DISPOSABLE) ×1 IMPLANT
TRAY FOLEY MTR SLVR 16FR STAT (SET/KITS/TRAYS/PACK) ×1 IMPLANT
WIPE CHG 2% 2PK PREOPERATIVE (MISCELLANEOUS) ×1 IMPLANT
YANKAUER SUCT BULB TIP NO VENT (SUCTIONS) ×1 IMPLANT

## 2022-07-23 NOTE — Interval H&P Note (Signed)
History and Physical Interval Note:  07/23/2022 12:51 PM  Arthur Beck  has presented today for surgery, with the diagnosis of Spinal Stenosis L4-5.  The various methods of treatment have been discussed with the patient and family. After consideration of risks, benefits and other options for treatment, the patient has consented to  Procedure(s) with comments: Lumbar laminotomy and decompression L4-5 Right (Right) - 3 C-Bed as a surgical intervention.  The patient's history has been reviewed, patient examined, no change in status, stable for surgery.  I have reviewed the patient's chart and labs.  Questions were answered to the patient's satisfaction.     Amnah Breuer C Niana Martorana   

## 2022-07-23 NOTE — Plan of Care (Signed)

## 2022-07-23 NOTE — Anesthesia Postprocedure Evaluation (Signed)
Anesthesia Post Note  Patient: Arthur Beck  Procedure(s) Performed: Lumbar laminotomy and decompression Lumbar Four-Five Right (Right)     Patient location during evaluation: PACU Anesthesia Type: General Level of consciousness: awake Pain management: pain level controlled Vital Signs Assessment: post-procedure vital signs reviewed and stable Respiratory status: spontaneous breathing Cardiovascular status: stable Postop Assessment: no apparent nausea or vomiting Anesthetic complications: yes   No notable events documented.  Last Vitals:  Vitals:   07/23/22 1540 07/23/22 1612  BP: (!) 154/66 (!) 149/77  Pulse: 73 76  Resp: 10 19  Temp: 36.6 C   SpO2: 96% 97%    Last Pain:  Vitals:   07/23/22 1540  TempSrc:   PainSc: 4                  Lawarence Meek

## 2022-07-23 NOTE — Interval H&P Note (Signed)
History and Physical Interval Note:  07/23/2022 12:51 PM  Arthur Beck  has presented today for surgery, with the diagnosis of Spinal Stenosis L4-5.  The various methods of treatment have been discussed with the patient and family. After consideration of risks, benefits and other options for treatment, the patient has consented to  Procedure(s) with comments: Lumbar laminotomy and decompression L4-5 Right (Right) - 3 C-Bed as a surgical intervention.  The patient's history has been reviewed, patient examined, no change in status, stable for surgery.  I have reviewed the patient's chart and labs.  Questions were answered to the patient's satisfaction.     [name]

## 2022-07-23 NOTE — Brief Op Note (Signed)
07/23/2022  12:55 PM  PATIENT:  Rodman Pickle Belcourt  81 y.o. male  PRE-OPERATIVE DIAGNOSIS:  Spinal Stenosis L4-5  POST-OPERATIVE DIAGNOSIS:  * No post-op diagnosis entered *  PROCEDURE:  Procedure(s) with comments: Lumbar laminotomy and decompression L4-5 Right (Right) - 3 C-Bed  SURGEON:  Surgeon(s) and Role:    Susa Day, MD - Primary  PHYSICIAN ASSISTANT:   ASSISTANTS: Bissell   ANESTHESIA:   general  EBL:  25   BLOOD ADMINISTERED:none  DRAINS: none   LOCAL MEDICATIONS USED:  MARCAINE     SPECIMEN:  No Specimen  DISPOSITION OF SPECIMEN:  N/A  COUNTS:  YES  TOURNIQUET:  * No tourniquets in log *  DICTATION: .Other Dictation: Dictation Number [id number]  PLAN OF CARE: Admit for overnight observation  PATIENT DISPOSITION:  PACU - hemodynamically stable.   Delay start of Pharmacological VTE agent (>24hrs) due to surgical blood loss or risk of bleeding: yes

## 2022-07-23 NOTE — Progress Notes (Signed)
Patient alert and oriented, mae's well, voiding adequate amount of urine, swallowing without difficulty, no c/o pain at time of discharge. Patient discharged home with family. Script and discharged instructions given to patient. Patient and family stated understanding of instructions given. Patient has an appointment with Dr. Beane in 2 weeks 

## 2022-07-23 NOTE — Op Note (Unsigned)
NAME: Arthur Beck, Arthur Beck MEDICAL RECORD NO: [id number] ACCOUNT NO: [id number] DATE OF BIRTH: [birth year] FACILITY: [facility] LOCATION: [facility] PHYSICIAN: Javier Docker, MD  Operative Report   DATE OF PROCEDURE: 07/23/2022  PREOPERATIVE DIAGNOSIS:  Spinal stenosis, HNP, L4-L5, right.  POSTOPERATIVE DIAGNOSES:  Spinal stenosis, HNP, L4-L5, right.  PROCEDURE PERFORMED:   1.  Micro hemilaminotomy L5-S1, right. 2.  Foraminotomies L4-L5, right. 3.  Microdiskectomy L4-L5, right. 4.  Lysis of hypertrophic venous plexus.  ANESTHESIA:  General.  ASSISTANT:  Andrez Grime, PA.  HISTORY:  This is an 81 year old male with refractory L5 radicular pain.  neural tension signs, EHL weakness with MRI indicating disk herniation and spinal stenosis.  Failing conservative treatment, indicated for decompression of L5 nerve root.  Risks  and benefits discussed including bleeding, infection, damage to neurovascular structures, no change in symptoms, worsening symptoms, DVT, PE, anesthetic complications, etc.  DESCRIPTION OF PROCEDURE:  The patient in supine position.  After induction of adequate general anesthesia, 2 grams Kefzol.  Foley was attempted to be placed.  It was unable to be placed.  The patient has a history of prostate issues.  He was then  therefore placed prone on the Wilson frame.  All bony prominences well padded.  Lumbar region was prepped and draped in the usual sterile fashion.  Two 18-gauge spinal needle was utilized to localize L4-L5 interspace, confirmed with x-ray.  Incision was  made from the spinous process 4-5.  Subcutaneous tissue was dissected.  Electrocautery was utilized to achieve hemostasis.  Dorsal lumbar fascia divided in line with skin incision.  Paraspinous muscle elevated from lateral L4-L5 on the right.  McCulloch  retractor was placed.  Operating microscope was draped and brought in the surgical field.  Confirmatory radiograph obtained.  Very small interlaminar window was noted  at L4-L5 on the right.  There was a hypertrophic facet due to facet arthrosis. With a  process from the inferior lamina cephalad.  I used a high-speed bur to perform hemilaminotomy of the caudad edge of 4.  I removed the spur off the superior lamina of L5.  Following this, we used a 3 mm Kerrison to widen the hemilaminotomy, less than 50%  of the facet was excised.  Following this, I identified the superior lamina of L5 and the superior articulating process of L5.  Preserved the pars.  Following this, I used micro curette to detach ligamentum flavum from the cephalad and medial edge of the  superior articulating process as well as the lamina of L5.  I then placed padding beneath the ligamentum flavum, removed the ligamentum flavum from the interspace.  I then enlarged the laminotomy of L5 as well as the medial aspect of the superior  articulating process was removed.  After protecting the neural elements there was a large hypertrophic venous plexus that was noted indicating an area of stenosis.  Bipolar cautery was utilized to achieve hemostasis and cauterized as plexus.  Following  this, I gently mobilized the identified nerve root medially, protected with D'Errico.  I then decompressed the lateral recess to the medial border of the pedicle and performed a generous foraminotomy of L5.  I identified a small herniated disk.  I  performed an annulotomy and a small portion of disk material was removed from the disk space.  Following this, the [facility] probe passed freely out the foramen of 4 and 5.  There was no disk herniation, residual noted.  There was 1 cm of excursion of the  L5 root  medially to the pedicle without tension.  An Woodson probe passed freely out the foramen of L5 distally below the pedicle of L5 and above the pedicle of 4.  I felt this was consistent with the stenosis seen on the MRI.  There was good  decompression of the L5 nerve root.  I then copiously irrigated with irrigation.  Thrombin-soaked Gelfoam was placed in the laminotomy defect.  I placed a Woodson retractors in the foramen of L5 and L4, confirmed by x-ray.  These were then removed.   Copiously irrigated once again.  Inspection revealed no evidence of CSF leakage or active bleeding.  I then removed the thrombin-soaked Gelfoam, closed the dorsal lumbar fascia with #1 Vicryl in interrupted figure-of-eight sutures, subcutaneous with 2-0  and skin with subcuticular Prolene.  Sterile dressing was applied.  Placed supine on the hospital bed, extubated, and transported to the recovery room in satisfactory condition.  The patient tolerated the procedure well.  No complications.  Assistant, Lacie Draft, [place], was used throughout the case for patient positioning, general intermittent neural retraction and suction.  BLOOD LOSS:  25 mL   PUS D: 07/23/2022 3:04:45 pm T: 07/23/2022 9:21:00 pm  JOB: [id number]/ [id number]

## 2022-07-23 NOTE — Evaluation (Signed)
Occupational Therapy Evaluation Patient Details Name: Arthur Beck MRN: [id number] DOB: [birth year] Today's Date: 07/23/2022   History of Present Illness 55 s/p Lumbar laminotomy and decompression L4-5 Right.  PMH includes: Arthritis      BPH (benign prostatic hyperplasia)     Headache     Hyperlipidemia      Mild   Hypothyroid     Peripheral neuropathy     Radiculopathy     Reflux.   Clinical Impression   Patient did pretty well, able to dress himself and walk to the bathroom with verbal cues and supervision.  Walked the hall to the stairs and climbed 3 without an AD or help.  Patient could go home, but he and his wife are nervous about pain control.  OT advised the patient if he remains overnight, OT will follow up in the am, or PT could see him.  Continue efforts as appropriate.  Precaution sheet left, reviewed with goo understanding.  All questions answered.        Recommendations for follow up therapy are one component of a multi-disciplinary discharge planning process, led by the attending physician.  Recommendations may be updated based on patient status, additional functional criteria and insurance authorization.   Follow Up Recommendations  No OT follow up    Assistance Recommended at Discharge Set up Supervision/Assistance  Patient can return home with the following Assist for transportation;Assistance with cooking/housework    Functional Status Assessment  Patient has had a recent decline in their functional status and demonstrates the ability to make significant improvements in function in a reasonable and predictable amount of time.  Equipment Recommendations  Tub/shower seat    Recommendations for Other Services       Precautions / Restrictions Precautions Precautions: Back Precaution Booklet Issued: Yes (comment) Restrictions Weight Bearing Restrictions: No      Mobility Bed Mobility Overal bed mobility: Needs Assistance Bed Mobility: Sidelying to Sit, Sit to  Sidelying   Sidelying to sit: Supervision     Sit to sidelying: Supervision      Transfers Overall transfer level: Needs assistance   Transfers: Sit to/from Stand Sit to Stand: Supervision           General transfer comment: shaky after surgery      Balance Overall balance assessment: Mild deficits observed, not formally tested                                         ADL either performed or assessed with clinical judgement   ADL                                         General ADL Comments: Generalized supervision ADL from sit to stand, walked halls, climbed 3 stairs without assist.     Vision Baseline Vision/History: 1 Wears glasses Patient Visual Report: No change from baseline       Perception Perception Perception: Within Functional Limits   Praxis Praxis Praxis: Intact    Pertinent Vitals/Pain Pain Assessment Pain Assessment: Faces Faces Pain Scale: Hurts whole lot Pain Location: low back Pain Descriptors / Indicators: Tender Pain Intervention(s): Premedicated before session     Hand Dominance Right   Extremity/Trunk Assessment Upper Extremity Assessment Upper Extremity Assessment: Overall WFL for tasks assessed (Tremors noted)  Lower Extremity Assessment Lower Extremity Assessment: Defer to PT evaluation   Cervical / Trunk Assessment Cervical / Trunk Assessment: Back Surgery   Communication Communication Communication: No difficulties   Cognition Arousal/Alertness: Awake/alert Behavior During Therapy: Flat affect Overall Cognitive Status: Within Functional Limits for tasks assessed                                       General Comments       Exercises     Shoulder Instructions      Home Living Family/patient expects to be discharged to:: Private residence Living Arrangements: Spouse/significant other Available Help at Discharge: Family;Available 24 hours/day Type of Home:  House Home Access: Stairs to enter [employer] of Steps: 3 Entrance Stairs-Rails: Right Home Layout: One level     Bathroom Shower/Tub: Walk-in shower;Tub/shower unit   Teacher, early years/pre: Yes How Accessible: Accessible via walker Home Equipment: None          Prior Functioning/Environment Prior Level of Function : Independent/Modified Independent;Driving                        OT Problem List: Pain      OT Treatment/Interventions: Self-care/ADL training;Balance training;Therapeutic activities    OT Goals(Current goals can be found in the care plan section) Acute Rehab OT Goals Patient Stated Goal: Home OT Goal Formulation: With patient Time For Goal Achievement: 07/30/22 Potential to Achieve Goals: Good ADL Goals Pt Will Perform Lower Body Dressing: with modified independence;sit to/from stand Pt Will Transfer to Toilet: Independently;ambulating;regular height toilet  OT Frequency: Min 2X/week    Co-evaluation              AM-PAC OT "6 Clicks" Daily Activity     Outcome Measure Help from another person eating meals?: None Help from another person taking care of personal grooming?: None Help from another person toileting, which includes using toliet, bedpan, or urinal?: A Little Help from another person bathing (including washing, rinsing, drying)?: A Little Help from another person to put on and taking off regular upper body clothing?: None Help from another person to put on and taking off regular lower body clothing?: A Little 6 Click Score: 21   End of Session Nurse Communication: Mobility status  Activity Tolerance: Patient tolerated treatment well Patient left: in bed;with call bell/phone within reach;with family/visitor present  OT Visit Diagnosis: Unsteadiness on feet (R26.81)                Time: 5027-7412 OT Time Calculation (min): 21 min Charges:  OT General Charges $OT Visit: 1 Visit OT  Evaluation $OT Eval Moderate Complexity: 1 Mod  07/23/2022  RP, OTR/L  Acute Rehabilitation Services  Office:  [phone]   Suzanna Obey 07/23/2022, 5:56 PM

## 2022-07-23 NOTE — Anesthesia Preprocedure Evaluation (Signed)
Anesthesia Evaluation  Patient identified by MRN, date of birth, ID band Patient awake    Reviewed: Allergy & Precautions, NPO status , Patient's Chart, lab work & pertinent test results  Airway Mallampati: II       Dental   Pulmonary neg pulmonary ROS, former smoker   breath sounds clear to auscultation       Cardiovascular negative cardio ROS  Rhythm:Regular Rate:Normal     Neuro/Psych  Headaches  Neuromuscular disease    GI/Hepatic Neg liver ROS,GERD  ,,  Endo/Other  Hypothyroidism    Renal/GU negative Renal ROS     Musculoskeletal  (+) Arthritis ,    Abdominal   Peds  Hematology   Anesthesia Other Findings   Reproductive/Obstetrics                             Anesthesia Physical Anesthesia Plan  ASA: 3  Anesthesia Plan: General   Post-op Pain Management:    Induction: Intravenous  PONV Risk Score and Plan: 2 and Ondansetron, Dexamethasone and Midazolam  Airway Management Planned: Oral ETT  Additional Equipment:   Intra-op Plan:   Post-operative Plan:   Informed Consent: I have reviewed the patients History and Physical, chart, labs and discussed the procedure including the risks, benefits and alternatives for the proposed anesthesia with the patient or authorized representative who has indicated his/her understanding and acceptance.     Dental advisory given  Plan Discussed with: Anesthesiologist and CRNA  Anesthesia Plan Comments:        Anesthesia Quick Evaluation

## 2022-07-23 NOTE — Transfer of Care (Signed)
Immediate Anesthesia Transfer of Care Note  Patient: Arthur Beck  Procedure(s) Performed: Lumbar laminotomy and decompression Lumbar Four-Five Right (Right)  Patient Location: PACU  Anesthesia Type:General  Level of Consciousness: awake, oriented, and patient cooperative  Airway & Oxygen Therapy: Patient Spontanous Breathing and Patient connected to face mask oxygen  Post-op Assessment: Report given to RN, Post -op Vital signs reviewed and stable, and Patient moving all extremities X 4  Post vital signs: Reviewed and stable  Last Vitals:  Vitals Value Taken Time  BP 157/61 07/23/22 1510  Temp 36.6 C 07/23/22 1510  Pulse 81 07/23/22 1512  Resp 18 07/23/22 1512  SpO2 94 % 07/23/22 1512  Vitals shown include unvalidated device data.  Last Pain:  Vitals:   07/23/22 1050  TempSrc:   PainSc: 0-No pain         Complications: No notable events documented.

## 2022-07-23 NOTE — Discharge Instructions (Signed)

## 2022-07-23 NOTE — Anesthesia Procedure Notes (Signed)
Procedure Name: Intubation Date/Time: 07/23/2022 1:09 PM  Performed by: Kyung Rudd, CRNAPre-anesthesia Checklist: Patient identified, Emergency Drugs available, Suction available and Patient being monitored Patient Re-evaluated:Patient Re-evaluated prior to induction Oxygen Delivery Method: Circle system utilized Preoxygenation: Pre-oxygenation with 100% oxygen Induction Type: IV induction Ventilation: Mask ventilation without difficulty Laryngoscope Size: Mac and 4 Grade View: Grade I Tube type: Oral Tube size: 7.5 mm Number of attempts: 1 Airway Equipment and Method: Stylet Placement Confirmation: ETT inserted through vocal cords under direct vision, positive ETCO2 and breath sounds checked- equal and bilateral Secured at: 22 cm Tube secured with: Tape Dental Injury: Teeth and Oropharynx as per pre-operative assessment

## 2022-07-24 ENCOUNTER — Encounter (HOSPITAL_COMMUNITY): Payer: Self-pay | Admitting: Specialist

## 2022-08-16 DIAGNOSIS — H3581 Retinal edema: Secondary | ICD-10-CM | POA: Diagnosis not present

## 2022-08-16 DIAGNOSIS — H34831 Tributary (branch) retinal vein occlusion, right eye, with macular edema: Secondary | ICD-10-CM | POA: Diagnosis not present

## 2022-08-16 DIAGNOSIS — H3582 Retinal ischemia: Secondary | ICD-10-CM | POA: Diagnosis not present

## 2022-08-16 DIAGNOSIS — H35033 Hypertensive retinopathy, bilateral: Secondary | ICD-10-CM | POA: Diagnosis not present

## 2022-08-16 DIAGNOSIS — H26491 Other secondary cataract, right eye: Secondary | ICD-10-CM | POA: Diagnosis not present

## 2022-08-16 DIAGNOSIS — Z961 Presence of intraocular lens: Secondary | ICD-10-CM | POA: Diagnosis not present

## 2022-08-17 DIAGNOSIS — M545 Low back pain, unspecified: Secondary | ICD-10-CM | POA: Diagnosis not present

## 2022-08-17 DIAGNOSIS — H34831 Tributary (branch) retinal vein occlusion, right eye, with macular edema: Secondary | ICD-10-CM | POA: Diagnosis not present

## 2022-08-24 DIAGNOSIS — M5451 Vertebrogenic low back pain: Secondary | ICD-10-CM | POA: Diagnosis not present

## 2022-08-31 DIAGNOSIS — M5451 Vertebrogenic low back pain: Secondary | ICD-10-CM | POA: Diagnosis not present

## 2022-09-02 DIAGNOSIS — M5451 Vertebrogenic low back pain: Secondary | ICD-10-CM | POA: Diagnosis not present

## 2022-09-07 DIAGNOSIS — M5451 Vertebrogenic low back pain: Secondary | ICD-10-CM | POA: Diagnosis not present

## 2022-09-09 DIAGNOSIS — M5451 Vertebrogenic low back pain: Secondary | ICD-10-CM | POA: Diagnosis not present

## 2022-09-14 DIAGNOSIS — M5451 Vertebrogenic low back pain: Secondary | ICD-10-CM | POA: Diagnosis not present

## 2022-09-16 DIAGNOSIS — M5451 Vertebrogenic low back pain: Secondary | ICD-10-CM | POA: Diagnosis not present

## 2022-09-17 DIAGNOSIS — H34831 Tributary (branch) retinal vein occlusion, right eye, with macular edema: Secondary | ICD-10-CM | POA: Diagnosis not present

## 2022-09-21 DIAGNOSIS — M5451 Vertebrogenic low back pain: Secondary | ICD-10-CM | POA: Diagnosis not present

## 2022-09-23 DIAGNOSIS — M5451 Vertebrogenic low back pain: Secondary | ICD-10-CM | POA: Diagnosis not present

## 2022-09-30 DIAGNOSIS — M5451 Vertebrogenic low back pain: Secondary | ICD-10-CM | POA: Diagnosis not present

## 2022-10-05 DIAGNOSIS — M5451 Vertebrogenic low back pain: Secondary | ICD-10-CM | POA: Diagnosis not present

## 2022-10-07 DIAGNOSIS — E039 Hypothyroidism, unspecified: Secondary | ICD-10-CM | POA: Diagnosis not present

## 2022-10-07 DIAGNOSIS — Z0000 Encounter for general adult medical examination without abnormal findings: Secondary | ICD-10-CM | POA: Diagnosis not present

## 2022-10-07 DIAGNOSIS — K219 Gastro-esophageal reflux disease without esophagitis: Secondary | ICD-10-CM | POA: Diagnosis not present

## 2022-10-07 DIAGNOSIS — E7800 Pure hypercholesterolemia, unspecified: Secondary | ICD-10-CM | POA: Diagnosis not present

## 2022-10-07 DIAGNOSIS — H3560 Retinal hemorrhage, unspecified eye: Secondary | ICD-10-CM | POA: Diagnosis not present

## 2022-10-07 DIAGNOSIS — Z23 Encounter for immunization: Secondary | ICD-10-CM | POA: Diagnosis not present

## 2022-10-07 DIAGNOSIS — E559 Vitamin D deficiency, unspecified: Secondary | ICD-10-CM | POA: Diagnosis not present

## 2022-10-08 DIAGNOSIS — M5451 Vertebrogenic low back pain: Secondary | ICD-10-CM | POA: Diagnosis not present

## 2022-10-11 DIAGNOSIS — M5451 Vertebrogenic low back pain: Secondary | ICD-10-CM | POA: Diagnosis not present

## 2022-10-18 DIAGNOSIS — H34831 Tributary (branch) retinal vein occlusion, right eye, with macular edema: Secondary | ICD-10-CM | POA: Diagnosis not present

## 2022-10-25 DIAGNOSIS — M5451 Vertebrogenic low back pain: Secondary | ICD-10-CM | POA: Diagnosis not present

## 2022-11-22 DIAGNOSIS — H34831 Tributary (branch) retinal vein occlusion, right eye, with macular edema: Secondary | ICD-10-CM | POA: Diagnosis not present

## 2022-12-21 DIAGNOSIS — H34831 Tributary (branch) retinal vein occlusion, right eye, with macular edema: Secondary | ICD-10-CM | POA: Diagnosis not present

## 2022-12-30 DIAGNOSIS — H43812 Vitreous degeneration, left eye: Secondary | ICD-10-CM | POA: Diagnosis not present

## 2022-12-30 DIAGNOSIS — H43392 Other vitreous opacities, left eye: Secondary | ICD-10-CM | POA: Diagnosis not present

## 2022-12-30 DIAGNOSIS — Z961 Presence of intraocular lens: Secondary | ICD-10-CM | POA: Diagnosis not present

## 2022-12-30 DIAGNOSIS — H26492 Other secondary cataract, left eye: Secondary | ICD-10-CM | POA: Diagnosis not present

## 2022-12-30 DIAGNOSIS — H53142 Visual discomfort, left eye: Secondary | ICD-10-CM | POA: Diagnosis not present

## 2022-12-30 DIAGNOSIS — H34831 Tributary (branch) retinal vein occlusion, right eye, with macular edema: Secondary | ICD-10-CM | POA: Diagnosis not present

## 2023-01-13 DIAGNOSIS — H43812 Vitreous degeneration, left eye: Secondary | ICD-10-CM | POA: Diagnosis not present

## 2023-01-13 DIAGNOSIS — H33102 Unspecified retinoschisis, left eye: Secondary | ICD-10-CM | POA: Diagnosis not present

## 2023-01-13 DIAGNOSIS — H43392 Other vitreous opacities, left eye: Secondary | ICD-10-CM | POA: Diagnosis not present

## 2023-01-13 DIAGNOSIS — H3582 Retinal ischemia: Secondary | ICD-10-CM | POA: Diagnosis not present

## 2023-01-13 DIAGNOSIS — H34831 Tributary (branch) retinal vein occlusion, right eye, with macular edema: Secondary | ICD-10-CM | POA: Diagnosis not present

## 2023-01-25 DIAGNOSIS — H34831 Tributary (branch) retinal vein occlusion, right eye, with macular edema: Secondary | ICD-10-CM | POA: Diagnosis not present

## 2023-02-24 DIAGNOSIS — H34831 Tributary (branch) retinal vein occlusion, right eye, with macular edema: Secondary | ICD-10-CM | POA: Diagnosis not present

## 2023-03-22 DIAGNOSIS — H43812 Vitreous degeneration, left eye: Secondary | ICD-10-CM | POA: Diagnosis not present

## 2023-03-22 DIAGNOSIS — H3582 Retinal ischemia: Secondary | ICD-10-CM | POA: Diagnosis not present

## 2023-03-22 DIAGNOSIS — H33102 Unspecified retinoschisis, left eye: Secondary | ICD-10-CM | POA: Diagnosis not present

## 2023-03-22 DIAGNOSIS — H26491 Other secondary cataract, right eye: Secondary | ICD-10-CM | POA: Diagnosis not present

## 2023-03-22 DIAGNOSIS — H34831 Tributary (branch) retinal vein occlusion, right eye, with macular edema: Secondary | ICD-10-CM | POA: Diagnosis not present

## 2023-03-28 DIAGNOSIS — H34831 Tributary (branch) retinal vein occlusion, right eye, with macular edema: Secondary | ICD-10-CM | POA: Diagnosis not present

## 2023-04-06 DIAGNOSIS — K219 Gastro-esophageal reflux disease without esophagitis: Secondary | ICD-10-CM | POA: Diagnosis not present

## 2023-04-06 DIAGNOSIS — E559 Vitamin D deficiency, unspecified: Secondary | ICD-10-CM | POA: Diagnosis not present

## 2023-04-06 DIAGNOSIS — I700 Atherosclerosis of aorta: Secondary | ICD-10-CM | POA: Diagnosis not present

## 2023-04-06 DIAGNOSIS — R5383 Other fatigue: Secondary | ICD-10-CM | POA: Diagnosis not present

## 2023-04-06 DIAGNOSIS — E039 Hypothyroidism, unspecified: Secondary | ICD-10-CM | POA: Diagnosis not present

## 2023-04-06 DIAGNOSIS — E7800 Pure hypercholesterolemia, unspecified: Secondary | ICD-10-CM | POA: Diagnosis not present

## 2023-04-06 DIAGNOSIS — M1990 Unspecified osteoarthritis, unspecified site: Secondary | ICD-10-CM | POA: Diagnosis not present

## 2023-04-06 DIAGNOSIS — R141 Gas pain: Secondary | ICD-10-CM | POA: Diagnosis not present

## 2023-04-26 DIAGNOSIS — Z961 Presence of intraocular lens: Secondary | ICD-10-CM | POA: Diagnosis not present

## 2023-04-26 DIAGNOSIS — H26491 Other secondary cataract, right eye: Secondary | ICD-10-CM | POA: Diagnosis not present

## 2023-04-26 DIAGNOSIS — H353131 Nonexudative age-related macular degeneration, bilateral, early dry stage: Secondary | ICD-10-CM | POA: Diagnosis not present

## 2023-04-26 DIAGNOSIS — H35371 Puckering of macula, right eye: Secondary | ICD-10-CM | POA: Diagnosis not present

## 2023-04-26 DIAGNOSIS — H04123 Dry eye syndrome of bilateral lacrimal glands: Secondary | ICD-10-CM | POA: Diagnosis not present

## 2023-05-02 DIAGNOSIS — H34831 Tributary (branch) retinal vein occlusion, right eye, with macular edema: Secondary | ICD-10-CM | POA: Diagnosis not present

## 2023-05-13 DIAGNOSIS — H3581 Retinal edema: Secondary | ICD-10-CM | POA: Diagnosis not present

## 2023-05-13 DIAGNOSIS — H3582 Retinal ischemia: Secondary | ICD-10-CM | POA: Diagnosis not present

## 2023-05-13 DIAGNOSIS — Z961 Presence of intraocular lens: Secondary | ICD-10-CM | POA: Diagnosis not present

## 2023-05-13 DIAGNOSIS — H34831 Tributary (branch) retinal vein occlusion, right eye, with macular edema: Secondary | ICD-10-CM | POA: Diagnosis not present

## 2023-05-13 DIAGNOSIS — H35033 Hypertensive retinopathy, bilateral: Secondary | ICD-10-CM | POA: Diagnosis not present

## 2023-06-13 DIAGNOSIS — H34831 Tributary (branch) retinal vein occlusion, right eye, with macular edema: Secondary | ICD-10-CM | POA: Diagnosis not present

## 2023-06-24 ENCOUNTER — Other Ambulatory Visit: Payer: Self-pay | Admitting: Family Medicine

## 2023-06-24 DIAGNOSIS — R142 Eructation: Secondary | ICD-10-CM | POA: Diagnosis not present

## 2023-06-24 DIAGNOSIS — N5089 Other specified disorders of the male genital organs: Secondary | ICD-10-CM

## 2023-06-30 ENCOUNTER — Ambulatory Visit
Admission: RE | Admit: 2023-06-30 | Discharge: 2023-06-30 | Disposition: A | Discharge status: Home / Self Care | Payer: Medicare Other | Source: Ambulatory Visit | Attending: Family Medicine | Admitting: Family Medicine

## 2023-06-30 DIAGNOSIS — N5089 Other specified disorders of the male genital organs: Secondary | ICD-10-CM

## 2023-08-01 DIAGNOSIS — H34831 Tributary (branch) retinal vein occlusion, right eye, with macular edema: Secondary | ICD-10-CM | POA: Diagnosis not present

## 2023-09-12 DIAGNOSIS — H34831 Tributary (branch) retinal vein occlusion, right eye, with macular edema: Secondary | ICD-10-CM | POA: Diagnosis not present

## 2023-10-10 DIAGNOSIS — K219 Gastro-esophageal reflux disease without esophagitis: Secondary | ICD-10-CM | POA: Diagnosis not present

## 2023-10-10 DIAGNOSIS — E559 Vitamin D deficiency, unspecified: Secondary | ICD-10-CM | POA: Diagnosis not present

## 2023-10-10 DIAGNOSIS — Z23 Encounter for immunization: Secondary | ICD-10-CM | POA: Diagnosis not present

## 2023-10-10 DIAGNOSIS — E7800 Pure hypercholesterolemia, unspecified: Secondary | ICD-10-CM | POA: Diagnosis not present

## 2023-10-10 DIAGNOSIS — M1990 Unspecified osteoarthritis, unspecified site: Secondary | ICD-10-CM | POA: Diagnosis not present

## 2023-10-10 DIAGNOSIS — G250 Essential tremor: Secondary | ICD-10-CM | POA: Diagnosis not present

## 2023-10-10 DIAGNOSIS — E039 Hypothyroidism, unspecified: Secondary | ICD-10-CM | POA: Diagnosis not present

## 2023-10-10 DIAGNOSIS — I700 Atherosclerosis of aorta: Secondary | ICD-10-CM | POA: Diagnosis not present

## 2023-10-10 DIAGNOSIS — Z0000 Encounter for general adult medical examination without abnormal findings: Secondary | ICD-10-CM | POA: Diagnosis not present

## 2023-11-08 DIAGNOSIS — H34831 Tributary (branch) retinal vein occlusion, right eye, with macular edema: Secondary | ICD-10-CM | POA: Diagnosis not present

## 2023-11-14 DIAGNOSIS — H35033 Hypertensive retinopathy, bilateral: Secondary | ICD-10-CM | POA: Diagnosis not present

## 2023-11-14 DIAGNOSIS — H34831 Tributary (branch) retinal vein occlusion, right eye, with macular edema: Secondary | ICD-10-CM | POA: Diagnosis not present

## 2023-11-18 DIAGNOSIS — G250 Essential tremor: Secondary | ICD-10-CM | POA: Diagnosis not present

## 2023-11-18 DIAGNOSIS — E039 Hypothyroidism, unspecified: Secondary | ICD-10-CM | POA: Diagnosis not present

## 2023-12-01 DIAGNOSIS — M79605 Pain in left leg: Secondary | ICD-10-CM | POA: Diagnosis not present

## 2023-12-01 DIAGNOSIS — M79604 Pain in right leg: Secondary | ICD-10-CM | POA: Diagnosis not present

## 2023-12-15 DIAGNOSIS — M6281 Muscle weakness (generalized): Secondary | ICD-10-CM | POA: Diagnosis not present

## 2023-12-27 DIAGNOSIS — H34831 Tributary (branch) retinal vein occlusion, right eye, with macular edema: Secondary | ICD-10-CM | POA: Diagnosis not present

## 2023-12-29 DIAGNOSIS — M5416 Radiculopathy, lumbar region: Secondary | ICD-10-CM | POA: Diagnosis not present

## 2024-01-12 DIAGNOSIS — M79605 Pain in left leg: Secondary | ICD-10-CM | POA: Diagnosis not present

## 2024-01-12 DIAGNOSIS — M79604 Pain in right leg: Secondary | ICD-10-CM | POA: Diagnosis not present

## 2024-01-24 DIAGNOSIS — H34831 Tributary (branch) retinal vein occlusion, right eye, with macular edema: Secondary | ICD-10-CM | POA: Diagnosis not present

## 2024-02-01 DIAGNOSIS — M6281 Muscle weakness (generalized): Secondary | ICD-10-CM | POA: Diagnosis not present

## 2024-02-07 DIAGNOSIS — M79604 Pain in right leg: Secondary | ICD-10-CM | POA: Diagnosis not present

## 2024-02-07 DIAGNOSIS — M79605 Pain in left leg: Secondary | ICD-10-CM | POA: Diagnosis not present

## 2024-02-09 DIAGNOSIS — M6281 Muscle weakness (generalized): Secondary | ICD-10-CM | POA: Diagnosis not present

## 2024-02-09 DIAGNOSIS — M5459 Other low back pain: Secondary | ICD-10-CM | POA: Diagnosis not present

## 2024-02-09 DIAGNOSIS — M545 Low back pain, unspecified: Secondary | ICD-10-CM | POA: Diagnosis not present

## 2024-02-14 DIAGNOSIS — M6281 Muscle weakness (generalized): Secondary | ICD-10-CM | POA: Diagnosis not present

## 2024-02-17 DIAGNOSIS — M6281 Muscle weakness (generalized): Secondary | ICD-10-CM | POA: Diagnosis not present

## 2024-02-21 DIAGNOSIS — H34831 Tributary (branch) retinal vein occlusion, right eye, with macular edema: Secondary | ICD-10-CM | POA: Diagnosis not present

## 2024-02-23 DIAGNOSIS — M6281 Muscle weakness (generalized): Secondary | ICD-10-CM | POA: Diagnosis not present

## 2024-03-04 DIAGNOSIS — M5459 Other low back pain: Secondary | ICD-10-CM | POA: Diagnosis not present

## 2024-03-13 DIAGNOSIS — M545 Low back pain, unspecified: Secondary | ICD-10-CM | POA: Diagnosis not present

## 2024-03-14 ENCOUNTER — Other Ambulatory Visit: Payer: Self-pay | Admitting: Specialist

## 2024-03-14 DIAGNOSIS — M545 Low back pain, unspecified: Secondary | ICD-10-CM

## 2024-03-19 NOTE — Discharge Instructions (Signed)

## 2024-03-20 ENCOUNTER — Ambulatory Visit
Admission: RE | Admit: 2024-03-20 | Discharge: 2024-03-20 | Disposition: A | Discharge status: Home / Self Care | Source: Ambulatory Visit | Attending: Specialist | Admitting: Specialist

## 2024-03-20 DIAGNOSIS — M48061 Spinal stenosis, lumbar region without neurogenic claudication: Secondary | ICD-10-CM | POA: Diagnosis not present

## 2024-03-20 DIAGNOSIS — M4807 Spinal stenosis, lumbosacral region: Secondary | ICD-10-CM | POA: Diagnosis not present

## 2024-03-20 DIAGNOSIS — M545 Low back pain, unspecified: Secondary | ICD-10-CM

## 2024-03-20 DIAGNOSIS — M5416 Radiculopathy, lumbar region: Secondary | ICD-10-CM | POA: Diagnosis not present

## 2024-03-20 MED ORDER — IOPAMIDOL (ISOVUE-M 200) INJECTION 41%
18.0000 mL | Freq: Once | INTRAMUSCULAR | Status: AC
  Administered 2024-03-20: 18 mL via INTRATHECAL

## 2024-03-20 MED ORDER — ONDANSETRON HCL 4 MG/2ML IJ SOLN: 4.0000 mg | Freq: Once | INTRAMUSCULAR | Status: DC | PRN

## 2024-03-20 MED ORDER — DIAZEPAM 5 MG PO TABS
5.0000 mg | ORAL_TABLET | Freq: Once | ORAL | Status: AC
  Administered 2024-03-20: 5 mg via ORAL

## 2024-03-20 MED ORDER — MEPERIDINE HCL 50 MG/ML IJ SOLN: 50.0000 mg | Freq: Once | INTRAMUSCULAR | Status: DC | PRN

## 2024-03-26 DIAGNOSIS — R3912 Poor urinary stream: Secondary | ICD-10-CM | POA: Diagnosis not present

## 2024-03-27 DIAGNOSIS — H34831 Tributary (branch) retinal vein occlusion, right eye, with macular edema: Secondary | ICD-10-CM | POA: Diagnosis not present

## 2024-04-04 DIAGNOSIS — M5459 Other low back pain: Secondary | ICD-10-CM | POA: Diagnosis not present

## 2024-04-04 DIAGNOSIS — M5416 Radiculopathy, lumbar region: Secondary | ICD-10-CM | POA: Diagnosis not present

## 2024-04-10 DIAGNOSIS — M5416 Radiculopathy, lumbar region: Secondary | ICD-10-CM | POA: Diagnosis not present

## 2024-04-24 DIAGNOSIS — H34831 Tributary (branch) retinal vein occlusion, right eye, with macular edema: Secondary | ICD-10-CM | POA: Diagnosis not present

## 2024-04-26 DIAGNOSIS — H353131 Nonexudative age-related macular degeneration, bilateral, early dry stage: Secondary | ICD-10-CM | POA: Diagnosis not present

## 2024-04-26 DIAGNOSIS — Z961 Presence of intraocular lens: Secondary | ICD-10-CM | POA: Diagnosis not present

## 2024-04-26 DIAGNOSIS — H26491 Other secondary cataract, right eye: Secondary | ICD-10-CM | POA: Diagnosis not present

## 2024-04-26 DIAGNOSIS — H35371 Puckering of macula, right eye: Secondary | ICD-10-CM | POA: Diagnosis not present

## 2024-04-26 DIAGNOSIS — H04123 Dry eye syndrome of bilateral lacrimal glands: Secondary | ICD-10-CM | POA: Diagnosis not present

## 2024-04-30 DIAGNOSIS — M5459 Other low back pain: Secondary | ICD-10-CM | POA: Diagnosis not present

## 2024-04-30 DIAGNOSIS — M5416 Radiculopathy, lumbar region: Secondary | ICD-10-CM | POA: Diagnosis not present

## 2024-05-08 DIAGNOSIS — K219 Gastro-esophageal reflux disease without esophagitis: Secondary | ICD-10-CM | POA: Diagnosis not present

## 2024-05-08 DIAGNOSIS — E7800 Pure hypercholesterolemia, unspecified: Secondary | ICD-10-CM | POA: Diagnosis not present

## 2024-05-08 DIAGNOSIS — E039 Hypothyroidism, unspecified: Secondary | ICD-10-CM | POA: Diagnosis not present

## 2024-05-08 DIAGNOSIS — E559 Vitamin D deficiency, unspecified: Secondary | ICD-10-CM | POA: Diagnosis not present

## 2024-05-09 DIAGNOSIS — M1990 Unspecified osteoarthritis, unspecified site: Secondary | ICD-10-CM | POA: Diagnosis not present

## 2024-05-09 DIAGNOSIS — G250 Essential tremor: Secondary | ICD-10-CM | POA: Diagnosis not present

## 2024-05-09 DIAGNOSIS — K219 Gastro-esophageal reflux disease without esophagitis: Secondary | ICD-10-CM | POA: Diagnosis not present

## 2024-05-09 DIAGNOSIS — G629 Polyneuropathy, unspecified: Secondary | ICD-10-CM | POA: Diagnosis not present

## 2024-05-09 DIAGNOSIS — E7800 Pure hypercholesterolemia, unspecified: Secondary | ICD-10-CM | POA: Diagnosis not present

## 2024-05-09 DIAGNOSIS — E039 Hypothyroidism, unspecified: Secondary | ICD-10-CM | POA: Diagnosis not present

## 2024-05-09 DIAGNOSIS — E559 Vitamin D deficiency, unspecified: Secondary | ICD-10-CM | POA: Diagnosis not present

## 2024-05-14 DIAGNOSIS — H3582 Retinal ischemia: Secondary | ICD-10-CM | POA: Diagnosis not present

## 2024-05-14 DIAGNOSIS — H35033 Hypertensive retinopathy, bilateral: Secondary | ICD-10-CM | POA: Diagnosis not present

## 2024-05-14 DIAGNOSIS — H34831 Tributary (branch) retinal vein occlusion, right eye, with macular edema: Secondary | ICD-10-CM | POA: Diagnosis not present

## 2024-05-22 DIAGNOSIS — H34831 Tributary (branch) retinal vein occlusion, right eye, with macular edema: Secondary | ICD-10-CM | POA: Diagnosis not present

## 2024-06-20 DIAGNOSIS — H34831 Tributary (branch) retinal vein occlusion, right eye, with macular edema: Secondary | ICD-10-CM | POA: Diagnosis not present

## 2024-07-16 DIAGNOSIS — R1012 Left upper quadrant pain: Secondary | ICD-10-CM | POA: Diagnosis not present

## 2024-07-25 DIAGNOSIS — H34831 Tributary (branch) retinal vein occlusion, right eye, with macular edema: Secondary | ICD-10-CM | POA: Diagnosis not present

## 2024-07-30 DIAGNOSIS — R350 Frequency of micturition: Secondary | ICD-10-CM | POA: Diagnosis not present

## 2024-09-11 ENCOUNTER — Ambulatory Visit: Admitting: Diagnostic Neuroimaging
